# Patient Record
Sex: Female | Born: 1965 | Race: White | Hispanic: Yes | Marital: Married | Smoking: Never smoker
Health system: Southern US, Community
[De-identification: ages and names within clinical notes are randomized; demographics above are authoritative.]

## PROBLEM LIST (undated history)

## (undated) DIAGNOSIS — I10 Essential (primary) hypertension: Secondary | ICD-10-CM

## (undated) DIAGNOSIS — E119 Type 2 diabetes mellitus without complications: Secondary | ICD-10-CM

## (undated) DIAGNOSIS — R7303 Prediabetes: Secondary | ICD-10-CM

## (undated) HISTORY — DX: Prediabetes: R73.03

## (undated) HISTORY — PX: CHOLECYSTECTOMY: SHX55

---

## 2005-07-05 ENCOUNTER — Ambulatory Visit: Payer: Self-pay | Admitting: Family Medicine

## 2005-08-05 ENCOUNTER — Ambulatory Visit: Payer: Self-pay | Admitting: *Deleted

## 2005-08-23 ENCOUNTER — Ambulatory Visit: Payer: Self-pay | Admitting: Family Medicine

## 2005-10-02 ENCOUNTER — Ambulatory Visit: Payer: Self-pay | Admitting: Family Medicine

## 2005-11-30 ENCOUNTER — Ambulatory Visit: Payer: Self-pay | Admitting: Family Medicine

## 2006-01-02 ENCOUNTER — Encounter (INDEPENDENT_AMBULATORY_CARE_PROVIDER_SITE_OTHER): Payer: Self-pay | Admitting: Specialist

## 2006-01-02 ENCOUNTER — Ambulatory Visit: Payer: Self-pay | Admitting: Family Medicine

## 2006-01-03 ENCOUNTER — Ambulatory Visit (HOSPITAL_COMMUNITY): Admission: RE | Admit: 2006-01-03 | Discharge: 2006-01-03 | Payer: Self-pay | Admitting: Internal Medicine

## 2006-05-12 ENCOUNTER — Emergency Department (HOSPITAL_COMMUNITY): Admission: EM | Admit: 2006-05-12 | Discharge: 2006-05-13 | Payer: Self-pay | Admitting: Emergency Medicine

## 2006-05-15 ENCOUNTER — Ambulatory Visit: Payer: Self-pay | Admitting: Family Medicine

## 2006-08-05 ENCOUNTER — Ambulatory Visit: Payer: Self-pay | Admitting: Family Medicine

## 2006-12-03 ENCOUNTER — Ambulatory Visit: Payer: Self-pay | Admitting: Family Medicine

## 2007-06-11 ENCOUNTER — Encounter (INDEPENDENT_AMBULATORY_CARE_PROVIDER_SITE_OTHER): Payer: Self-pay | Admitting: *Deleted

## 2009-03-23 ENCOUNTER — Encounter: Payer: Self-pay | Admitting: Physician Assistant

## 2009-03-23 LAB — CONVERTED CEMR LAB

## 2009-04-11 ENCOUNTER — Ambulatory Visit: Payer: Self-pay | Admitting: Physician Assistant

## 2009-04-11 DIAGNOSIS — M546 Pain in thoracic spine: Secondary | ICD-10-CM | POA: Insufficient documentation

## 2009-04-11 DIAGNOSIS — R030 Elevated blood-pressure reading, without diagnosis of hypertension: Secondary | ICD-10-CM

## 2009-04-11 DIAGNOSIS — G43909 Migraine, unspecified, not intractable, without status migrainosus: Secondary | ICD-10-CM | POA: Insufficient documentation

## 2009-04-11 DIAGNOSIS — R5383 Other fatigue: Secondary | ICD-10-CM

## 2009-04-11 DIAGNOSIS — R5381 Other malaise: Secondary | ICD-10-CM

## 2009-04-11 DIAGNOSIS — R928 Other abnormal and inconclusive findings on diagnostic imaging of breast: Secondary | ICD-10-CM | POA: Insufficient documentation

## 2009-04-13 ENCOUNTER — Encounter: Payer: Self-pay | Admitting: Physician Assistant

## 2009-04-19 ENCOUNTER — Ambulatory Visit (HOSPITAL_BASED_OUTPATIENT_CLINIC_OR_DEPARTMENT_OTHER): Admission: RE | Admit: 2009-04-19 | Discharge: 2009-04-19 | Payer: Self-pay | Admitting: Physician Assistant

## 2009-04-19 ENCOUNTER — Ambulatory Visit: Payer: Self-pay | Admitting: Physician Assistant

## 2009-04-19 ENCOUNTER — Telehealth: Payer: Self-pay | Admitting: Physician Assistant

## 2009-04-21 ENCOUNTER — Ambulatory Visit (HOSPITAL_COMMUNITY): Admission: RE | Admit: 2009-04-21 | Discharge: 2009-04-21 | Payer: Self-pay | Admitting: Internal Medicine

## 2009-04-21 ENCOUNTER — Encounter: Payer: Self-pay | Admitting: Physician Assistant

## 2009-04-23 ENCOUNTER — Ambulatory Visit: Payer: Self-pay | Admitting: Internal Medicine

## 2009-04-23 ENCOUNTER — Encounter: Payer: Self-pay | Admitting: Physician Assistant

## 2009-04-25 ENCOUNTER — Ambulatory Visit: Payer: Self-pay | Admitting: Physician Assistant

## 2009-05-03 ENCOUNTER — Ambulatory Visit: Payer: Self-pay | Admitting: Physician Assistant

## 2009-05-06 ENCOUNTER — Encounter: Payer: Self-pay | Admitting: Physician Assistant

## 2009-05-13 ENCOUNTER — Ambulatory Visit: Payer: Self-pay | Admitting: Physician Assistant

## 2009-05-13 DIAGNOSIS — F329 Major depressive disorder, single episode, unspecified: Secondary | ICD-10-CM

## 2009-06-03 ENCOUNTER — Telehealth: Payer: Self-pay | Admitting: Physician Assistant

## 2009-06-10 ENCOUNTER — Ambulatory Visit: Payer: Self-pay | Admitting: Physician Assistant

## 2009-06-24 ENCOUNTER — Telehealth: Payer: Self-pay | Admitting: Physician Assistant

## 2009-06-24 ENCOUNTER — Ambulatory Visit: Payer: Self-pay | Admitting: Physician Assistant

## 2009-06-27 ENCOUNTER — Encounter: Payer: Self-pay | Admitting: Physician Assistant

## 2009-07-04 ENCOUNTER — Ambulatory Visit: Payer: Self-pay | Admitting: Physician Assistant

## 2009-07-05 ENCOUNTER — Encounter: Payer: Self-pay | Admitting: Physician Assistant

## 2009-07-05 LAB — CONVERTED CEMR LAB
ALT: 18 U/L
AST: 16 U/L
Albumin: 4.7 g/dL
Alkaline Phosphatase: 64 U/L
BUN: 12 mg/dL
Basophils Absolute: 0 K/uL
Basophils Relative: 0 %
CO2: 20 meq/L
Calcium: 9.1 mg/dL
Chloride: 104 meq/L
Cholesterol: 145 mg/dL
Creatinine, Ser: 0.69 mg/dL
Eosinophils Absolute: 0.1 K/uL
Eosinophils Relative: 2 %
Glucose, Bld: 83 mg/dL
HCT: 40.6 %
HDL: 32 mg/dL — ABNORMAL LOW
Hemoglobin: 13.3 g/dL
LDL Cholesterol: 86 mg/dL
Lymphocytes Relative: 31 %
Lymphs Abs: 2.1 K/uL
MCHC: 32.8 g/dL
MCV: 94.4 fL
Monocytes Absolute: 0.4 K/uL
Monocytes Relative: 6 %
Neutro Abs: 4.2 K/uL
Neutrophils Relative %: 61 %
Platelets: 273 K/uL
Potassium: 4.3 meq/L
RBC: 4.3 M/uL
RDW: 14.6 %
Sodium: 139 meq/L
TSH: 1.158 u[IU]/mL
Total Bilirubin: 0.8 mg/dL
Total CHOL/HDL Ratio: 4.5
Total Protein: 7.4 g/dL
Triglycerides: 137 mg/dL
VLDL: 27 mg/dL
WBC: 6.9 10*3/microliter

## 2009-08-05 ENCOUNTER — Ambulatory Visit: Payer: Self-pay | Admitting: Physician Assistant

## 2009-08-25 ENCOUNTER — Ambulatory Visit: Payer: Self-pay | Admitting: Physician Assistant

## 2009-08-25 DIAGNOSIS — B351 Tinea unguium: Secondary | ICD-10-CM

## 2009-08-25 DIAGNOSIS — S139XXA Sprain of joints and ligaments of unspecified parts of neck, initial encounter: Secondary | ICD-10-CM

## 2009-09-22 ENCOUNTER — Ambulatory Visit: Payer: Self-pay | Admitting: Physician Assistant

## 2009-09-22 LAB — CONVERTED CEMR LAB
AST: 12 units/L (ref 0–37)
Albumin: 4.5 g/dL (ref 3.5–5.2)
Alkaline Phosphatase: 73 units/L (ref 39–117)
Indirect Bilirubin: 0.5 mg/dL (ref 0.0–0.9)
Total Bilirubin: 0.6 mg/dL (ref 0.3–1.2)
Total Protein: 7.4 g/dL (ref 6.0–8.3)

## 2009-09-27 ENCOUNTER — Encounter: Payer: Self-pay | Admitting: Physician Assistant

## 2009-10-24 ENCOUNTER — Encounter: Payer: Self-pay | Admitting: Physician Assistant

## 2009-12-26 ENCOUNTER — Ambulatory Visit: Payer: Self-pay | Admitting: Physician Assistant

## 2009-12-26 DIAGNOSIS — M545 Low back pain: Secondary | ICD-10-CM

## 2010-04-01 ENCOUNTER — Encounter (INDEPENDENT_AMBULATORY_CARE_PROVIDER_SITE_OTHER): Payer: Self-pay | Admitting: Internal Medicine

## 2010-10-24 NOTE — Miscellaneous (Signed)
Summary: documentation Hx of LEEP Procedure.   Clinical Lists Changes  Observations: Added new observation of PAST SURG HX: 1.  Reported LEEP procedure following abnormal in [place] some years ago--from [facility]D records (04/01/2010 12:06)        Past History:  Past Surgical History: 1.  Reported LEEP procedure following abnormal in [place] some years ago--from [facility]D records

## 2010-10-24 NOTE — Letter (Signed)
Summary: SOCIAL WORK//AMANDA//APPT DATEE & TIME  SOCIAL WORK//AMANDA//APPT DATEE & TIME   Imported By: Arta Bruce 12/15/2009 10:09:46  _____________________________________________________________________  External Attachment:    Type:   Image     Comment:   External Document

## 2010-10-24 NOTE — Progress Notes (Signed)
Summary: Office Visit/DEPRESSION SCREENING  Office Visit/DEPRESSION SCREENING   Imported By: Arta Bruce 10/17/2009 08:21:06  _____________________________________________________________________  External Attachment:    Type:   Image     Comment:   External Document

## 2010-10-24 NOTE — Letter (Signed)
Summary: RECORDS RECEIVED FROM [facility] RECEIVED FROM [facility]   Imported By: Arta Bruce 05/15/2010 11:33:08  _____________________________________________________________________  External Attachment:    Type:   Image     Comment:   External Document

## 2010-10-24 NOTE — Assessment & Plan Note (Signed)
Summary: follow up app in 4 months with Lorin Picket for depesion//gk   Vital Signs:  Patient profile:   45 year old female Weight:      161.25 pounds Temp:     98.1 degrees F Pulse rate:   76 / minute Pulse rhythm:   regular Resp:     20 per minute BP sitting:   143 / 87  (left arm) Cuff size:   regular  Vitals Entered By: Chauncy Passy, SMA  Serial Vital Signs/Assessments:  Time      Position  BP       Pulse  Resp  Temp     By 9:38 AM             128/88                         Tereso Newcomer PA-C  CC: Pt. is here for a 4 month f/u on depression. Pt. states she is feeling better and has not been taking her meds. for about 1 month. Pt. is still complaining of back pain which usually happens after laying down for a long period. , Hypertension Management Is Patient Diabetic? No Pain Assessment Patient in pain? no       Does patient need assistance? Functional Status Self care Ambulation Normal   Primary Care Provider:  Tereso Newcomer PA-C  CC:  Pt. is here for a 4 month f/u on depression. Pt. states she is feeling better and has not been taking her meds. for about 1 month. Pt. is still complaining of back pain which usually happens after laying down for a long period.  and Hypertension Management.  History of Present Illness: Here for f/u.  Depression:  No longer taking zoloft.  Stopped about 1 month ago.  She does not feel like she needs it anymore.  Feels like her mood is ok.  No suicidal thoughts.  PHQ9=6 today.  Onychomycosis:  She stopped taking lamisil due to nausea.  She got a cream OTC and the nail cleared.  Back pain:  Lumbar area.  Pain occurs with lying down.  Starts around 4 am.  No activity makes worse.  Pain sometimes continues throughout the day.  Moreso at night.  Husband thinks the mattress may cause some of her problems.  She did sleep on floor a few times and this helped.  No radicular symptoms.  No loss of b/b fxn.    Hypertension History:      Negative major  cardiovascular risk factors include female age less than 49 years old and non-tobacco-user status.     Current Medications (verified): 1)  Methocarbamol 500 Mg Tabs (Methocarbamol) .... Take 1-2  Tabs By Mouth At Bedtime As Needed Back Pain 2)  Naproxen 500 Mg Tabs (Naproxen) .... Take 1 Tablet By Mouth Two Times A Day As Needed 3)  Zoloft 50 Mg Tabs (Sertraline Hcl) .... Take 1/2 Tablet Once Daily 4)  Lamisil 250 Mg Tabs (Terbinafine Hcl) .... Take 1 Tablet By Mouth Once A Day For Nail Fungus (Please Write in Spanish)  Allergies (verified): No Known Drug Allergies  Physical Exam  General:  alert, well-developed, and well-nourished.   Head:  normocephalic and atraumatic.   Neck:  supple.   Lungs:  normal breath sounds.   Heart:  normal rate and regular rhythm.   Msk:  neg SLR bilat no spinal tend to palp  Neurologic:  patellar and achilles DTRs 2+ bilat BLE strength  5+ and equal bilat alert & oriented X3 and cranial nerves II-XII intact.   Psych:  normally interactive and good eye contact.     Impression & Recommendations:  Problem # 1:  ELEVATED BLOOD PRESSURE WITHOUT DIAGNOSIS OF HYPERTENSION (ICD-796.2) repeat bp ok cont with TLC  Problem # 2:  DEPRESSION (ICD-311) pt now off meds and feels ok PHQ9=6 today no SI  The following medications were removed from the medication list:    Zoloft 50 Mg Tabs (Sertraline hcl) .Marland Kitchen... Take 1/2 tablet once daily  Problem # 3:  CERVICAL STRAIN (ICD-847.0)  Her updated medication list for this problem includes:    Methocarbamol 500 Mg Tabs (Methocarbamol) .Marland Kitchen... Take 1-2  tabs by mouth at bedtime as needed back pain    Naproxen 500 Mg Tabs (Naproxen) .Marland Kitchen... Take 1 tablet by mouth two times a day as needed  Problem # 4:  BACK PAIN, LUMBAR (ICD-724.2) strain related to mattress suggest she change mattress and take naproxen as needed  Her updated medication list for this problem includes:    Methocarbamol 500 Mg Tabs (Methocarbamol)  .Marland Kitchen... Take 1-2  tabs by mouth at bedtime as needed back pain    Naproxen 500 Mg Tabs (Naproxen) .Marland Kitchen... Take 1 tablet by mouth two times a day as needed  Problem # 5:  Preventive Health Care (ICD-V70.0) due for CPE in 03/2010  Complete Medication List: 1)  Methocarbamol 500 Mg Tabs (Methocarbamol) .... Take 1-2  tabs by mouth at bedtime as needed back pain 2)  Naproxen 500 Mg Tabs (Naproxen) .... Take 1 tablet by mouth two times a day as needed  Hypertension Assessment/Plan:      The patient's hypertensive risk group is category A: No risk factors and no target organ damage.  Her calculated 10 year risk of coronary heart disease is 5 %.  Today's blood pressure is 143/87.    Patient Instructions: 1)  Follow up in July with Roselee Tayloe for CPP. 2)  Take naproxen as needed for pain.  Take with food. 3)  I suggest you look for a new mattress.

## 2010-10-24 NOTE — Progress Notes (Signed)
Summary: Office Visit//DEPRESSION SCREENING  Office Visit//DEPRESSION SCREENING   Imported By: Arta Bruce 02/28/2010 10:56:43  _____________________________________________________________________  External Attachment:    Type:   Image     Comment:   External Document

## 2010-10-24 NOTE — Letter (Signed)
Summary: *HSN Results Follow up  [facility]  [address] [place], [place] [address]   Phone: [phone]  Fax: [phone]      09/27/2009   [facility] [address] [place], [place]  [address]   Dear  Ms. Sara Mora,                            ____S.Drinkard,FNP   ____D. Gore,FNP       ____B. McPherson,MD   ____V. Rankins,MD    ____E. Mulberry,MD    ____N. Daphine Deutscher, FNP  ____D. Reche Dixon, MD    ____K. Philipp Deputy, MD    _x___S. Alben Spittle, PA-C     This letter is to inform you that your recent test(s):  _______Pap Smear    _______Lab Test     _______X-ray    ___x____ is within acceptable limits  _______ requires a medication change  _______ requires a follow-up lab visit  _______ requires a follow-up visit with your provider   Comments:       _________________________________________________________ If you have any questions, please contact our office                     Sincerely,  Tereso Newcomer PA-C [facility]

## 2010-10-24 NOTE — Miscellaneous (Signed)
Summary: Patient completed counseling with LCSW 08/2009   Clinical Lists Changes  Observations: Added new observation of PAST MED HX: Migraine headaches h/o abnormal pap in past ? h/o "colitis" Depression   a.  Pt. completed counseling with LCSW in 08/2009 (10/24/2009 16:07)       Past History:  Past Medical History: Migraine headaches h/o abnormal pap in past ? h/o "colitis" Depression   a.  Pt. completed counseling with LCSW in 08/2009

## 2011-02-06 NOTE — Procedures (Signed)
NAME:  JULITZA, RICKLES          ACCOUNT NO.:  [id number]   MEDICAL RECORD NO.:  [id number]          PATIENT TYPE:  OUT   LOCATION:  SLEEP CENTER                 FACILITY[facility]  [facility]   PHYSICIAN:  Clinton D. Maple Hudson, MD, FCCP, FACPDATE OF BIRTH:  1966-09-02   DATE OF STUDY:  04/23/2009                            NOCTURNAL POLYSOMNOGRAM   REFERRING PHYSICIAN:   REFERRING PHYSICIAN:  Tereso Newcomer, PA-C   INDICATION FOR STUDY:  Hypersomnia with sleep apnea.   EPWORTH SLEEPINESS SCORE:  19/24.  BMI 29.1.  Weight 164 pounds, height  63 inches.  Neck 14 inches.   MEDICATIONS:  Home medications charted and reviewed.   SLEEP ARCHITECTURE:  Total sleep time 316 minutes with sleep efficiency  78.9%.  Stage I 5.5%, stage II 78.5%, stage III absent.  REM 16% of  total sleep time.  Sleep latency 6.5 minutes.  REM latency 153 minutes.  Awake after sleep onset 77.5 minutes.  Arousal index 16.1.  No bedtime  medication taken.  She had interval of sustained wakefulness between  1:30 and 3:00 a.m.   RESPIRATORY DATA:  Apnea/hypopnea index (AHI) 0.4 per hour.  A total of  2 events was scored including one obstructive apnea and one central  apnea.  These events were associated with non supine sleep.  REM AHI 1.2  per hour.  An additional 22 marginal events with respiratory effort  related to arousal were recognized, not meeting duration and intensity  criteria to be considered apneas or hypopneas, but contributing to an  overall respiratory disturbance index (RDI) of 4.5 per hour.  There were  insufficient events to permit CPAP titration by split protocol on the  study night.   OXYGEN DATA:  Mild snoring with oxygen desaturation to a nadir of 92% on  room air.  Mean oxygen saturation through the study was 96.2% on room  air through the study.   CARDIAC DATA:  Normal sinus rhythm.   MOVEMENT-PARASOMNIA:  No significant movement disturbance.  Bathroom x1.   IMPRESSIONS-RECOMMENDATIONS:  Occasional  respiratory events with sleep  disturbance, within normal limits, apnea-hypopnea index 0.4 per hour  (normal range 0 to 5 per hour), respiratory disturbance index 4.5 per  hour.  Mild snoring with oxygen desaturation to a nadir of 92% on room  air.      Clinton D. Maple Hudson, MD, [facility], FACP  Diplomate, Biomedical engineer of Sleep Medicine  Electronically Signed     CDY/MEDQ  D:  04/23/2009 11:23:36  T:  04/23/2009 23:35:38  Job:  [id number]

## 2014-06-28 ENCOUNTER — Ambulatory Visit: Payer: Self-pay

## 2014-07-02 ENCOUNTER — Encounter: Payer: Self-pay | Admitting: Internal Medicine

## 2014-07-02 ENCOUNTER — Ambulatory Visit: Payer: Self-pay | Attending: Internal Medicine | Admitting: Internal Medicine

## 2014-07-02 VITALS — BP 144/84 | HR 65 | Temp 98.3°F | Resp 16 | Wt 158.2 lb

## 2014-07-02 DIAGNOSIS — Z139 Encounter for screening, unspecified: Secondary | ICD-10-CM

## 2014-07-02 DIAGNOSIS — Z23 Encounter for immunization: Secondary | ICD-10-CM

## 2014-07-02 DIAGNOSIS — G8929 Other chronic pain: Secondary | ICD-10-CM

## 2014-07-02 DIAGNOSIS — M545 Low back pain: Secondary | ICD-10-CM | POA: Insufficient documentation

## 2014-07-02 DIAGNOSIS — M62830 Muscle spasm of back: Secondary | ICD-10-CM

## 2014-07-02 DIAGNOSIS — Z8489 Family history of other specified conditions: Secondary | ICD-10-CM | POA: Insufficient documentation

## 2014-07-02 DIAGNOSIS — I10 Essential (primary) hypertension: Secondary | ICD-10-CM

## 2014-07-02 LAB — CBC WITH DIFFERENTIAL/PLATELET
BASOS ABS: 0 10*3/uL (ref 0.0–0.1)
BASOS PCT: 0 % (ref 0–1)
EOS PCT: 2 % (ref 0–5)
Eosinophils Absolute: 0.1 10*3/uL (ref 0.0–0.7)
HEMATOCRIT: 41.7 % (ref 36.0–46.0)
Hemoglobin: 14.1 g/dL (ref 12.0–15.0)
LYMPHS ABS: 2.4 10*3/uL (ref 0.7–4.0)
Lymphocytes Relative: 38 % (ref 12–46)
MCH: 30.7 pg (ref 26.0–34.0)
MCHC: 33.8 g/dL (ref 30.0–36.0)
MCV: 90.8 fL (ref 78.0–100.0)
MONO ABS: 0.4 10*3/uL (ref 0.1–1.0)
MONOS PCT: 6 % (ref 3–12)
NEUTROS ABS: 3.4 10*3/uL (ref 1.7–7.7)
NEUTROS PCT: 54 % (ref 43–77)
PLATELETS: 272 10*3/uL (ref 150–400)
RBC: 4.59 MIL/uL (ref 3.87–5.11)
RDW: 13.9 % (ref 11.5–15.5)
WBC: 6.3 10*3/uL (ref 4.0–10.5)

## 2014-07-02 LAB — COMPLETE METABOLIC PANEL WITH GFR
ALK PHOS: 87 U/L (ref 39–117)
ALT: 33 U/L (ref 0–35)
AST: 23 U/L (ref 0–37)
Albumin: 4.7 g/dL (ref 3.5–5.2)
BUN: 9 mg/dL (ref 6–23)
CALCIUM: 10 mg/dL (ref 8.4–10.5)
CO2: 25 mEq/L (ref 19–32)
Chloride: 103 mEq/L (ref 96–112)
Creat: 0.64 mg/dL (ref 0.50–1.10)
GFR, Est Non African American: >89 mL/min
Glucose, Bld: 94 mg/dL (ref 70–99)
POTASSIUM: 4.2 meq/L (ref 3.5–5.3)
SODIUM: 141 meq/L (ref 135–145)
Total Bilirubin: 1.1 mg/dL (ref 0.2–1.2)
Total Protein: 7.6 g/dL (ref 6.0–8.3)

## 2014-07-02 LAB — LIPID PANEL
Cholesterol: 170 mg/dL (ref 0–200)
HDL: 39 mg/dL — ABNORMAL LOW (ref >39)
LDL Cholesterol: 85 mg/dL (ref 0–99)
TRIGLYCERIDES: 232 mg/dL — AB (ref <150)
Total CHOL/HDL Ratio: 4.4 Ratio
VLDL: 46 mg/dL — ABNORMAL HIGH (ref 0–40)

## 2014-07-02 LAB — TSH: TSH: 0.975 u[IU]/mL (ref 0.350–4.500)

## 2014-07-02 MED ORDER — TRAMADOL HCL 50 MG PO TABS: 50.0000 mg | ORAL_TABLET | Freq: Three times a day (TID) | ORAL | Status: DC | PRN

## 2014-07-02 MED ORDER — BACLOFEN 10 MG PO TABS: 10.0000 mg | ORAL_TABLET | Freq: Every evening | ORAL | Status: DC | PRN

## 2014-07-02 NOTE — Progress Notes (Signed)
Patient here to establish care Complains of back pain and spasms Denies any injury

## 2014-07-02 NOTE — Patient Instructions (Signed)
Plan de alimentacin DASH (DASH Eating Plan) DASH es la sigla en ingls de "Enfoques Alimentarios para Detener la Hipertensin". El plan de alimentacin DASH ha demostrado bajar la presin arterial elevada (hipertensin). Los beneficios adicionales para la salud pueden incluir la disminucin del riesgo de diabetes mellitus tipo2, enfermedades cardacas e ictus. Este plan tambin puede ayudar a adelgazar. QU DEBO SABER ACERCA DEL PLAN DE ALIMENTACIN DASH? Para el plan de alimentacin DASH, seguir las siguientes pautas generales:  Elija los alimentos con un valor porcentual diario de sodio de menos del 5% (segn figura en la etiqueta del alimento).  Use hierbas o aderezos sin sal, en lugar de sal de mesa o sal marina.  Consulte al mdico o farmacutico antes de usar sustitutos de la sal.  Coma productos con bajo contenido de sodio, cuya etiqueta suele decir "bajo contenido de sodio" o "sin agregado de sal".  Coma alimentos frescos.  Coma ms verduras, frutas y productos lcteos con bajo contenido de grasas.  Elija los cereales integrales. Busque la palabra "integral" en el primer lugar de la lista de ingredientes.  Elija el pescado y el pollo o el pavo sin piel ms a menudo que las carnes rojas. Limite el consumo de pescado, carne de ave y carne a 6onzas (170g) por da.  Limite el consumo de dulces, postres, azcares y bebidas azucaradas.  Elija las grasas saludables para el corazn.  Limite el consumo de queso a 1onza (28g) por da.  Consuma ms comida casera y menos de restaurante, de buf y comida rpida.  Limite el consumo de alimentos fritos.  Cocine los alimentos utilizando mtodos que no sean la fritura.  Limite las verduras enlatadas. Si las consume, enjuguelas bien para disminuir el sodio.  Cuando coma en un restaurante, pida que preparen su comida con menos sal o, en lo posible, sin nada de sal. QU ALIMENTOS PUEDO COMER? Pida ayuda a un nutricionista para  conocer las necesidades calricas individuales. Cereales Pan de salvado o integral. Arroz integral. Pastas de salvado o integrales. Quinua, trigo burgol y cereales integrales. Cereales con bajo contenido de sodio. Tortillas de harina de maz o de salvado. Pan de maz integral. Galletas saladas integrales. Galletas con bajo contenido de sodio. Vegetales Verduras frescas o congeladas (crudas, al vapor, asadas o grilladas). Jugos de tomate y verduras con contenido bajo o reducido de sodio. Pasta y salsa de tomate con contenido bajo o reducido de sodio. Verduras enlatadas con bajo contenido de sodio o reducido de sodio.  Frutas Frutas frescas, en conserva (en su jugo natural) o frutas congeladas. Carnes y otros productos con protenas Carne de res molida (al 85% o ms magra), carne de res de animales alimentados con pastos o carne de res sin la grasa. Pollo o pavo sin piel. Carne de pollo o de pavo molida. Cerdo sin la grasa. Todos los pescados y frutos de mar. Huevos. Porotos, guisantes o lentejas secos. Frutos secos y semillas sin sal. Frijoles enlatados sin sal. Lcteos Productos lcteos con bajo contenido de grasas, como leche descremada o al 1%, quesos reducidos en grasas o al 2%, ricota con bajo contenido de grasas o queso cottage, o yogur natural con bajo contenido de grasas. Quesos con contenido bajo o reducido de sodio. Grasas y aceites Margarinas en barra que no contengan grasas trans. Mayonesa y alios para ensaladas livianos o reducidos en grasas (reducidos en sodio). [place]ac[place]. Aceites de crtamo, oliva o canola. Mantequilla natural de man o almendra. Otros Palomitas de maz y pretzels sin sal.   Los artculos mencionados arriba pueden no ser una lista completa de las bebidas o los alimentos recomendados. Comunquese con el nutricionista para conocer ms opciones. QU ALIMENTOS NO SE RECOMIENDAN? Cereales Pan blanco. Pastas blancas. Arroz blanco. Pan de maz refinado. Bagels y  croissants. Galletas saladas que contengan grasas trans. Vegetales Vegetales con crema o fritos. Verduras en salsa de queso. Verduras enlatadas comunes. Pasta y salsa de tomate en lata comunes. Jugos comunes de tomate y de verduras. Frutas Frutas secas. Fruta enlatada en almbar liviano o espeso. Jugo de frutas. Carnes y otros productos con protenas Cortes de carne con grasa. Costillas, alas de pollo, tocineta, salchicha, mortadela, salame, chinchulines, tocino, perros calientes, salchichas alemanas y embutidos envasados. Frutos secos y semillas con sal. Frijoles con sal en lata. Lcteos Leche entera o al 2%, crema, mezcla de leche y crema, y queso crema. Yogur entero o endulzado. Quesos o queso azul con alto contenido de grasas. Cremas no lcteas y coberturas batidas. Quesos procesados, quesos para untar o cuajadas. Condimentos Sal de cebolla y ajo, sal condimentada, sal de mesa y sal marina. Salsas en lata y envasadas. Salsa Worcestershire. Salsa trtara. Salsa barbacoa. Salsa teriyaki. Salsa de soja, incluso la que tiene contenido reducido de sodio. Salsa de carne. Salsa de pescado. Salsa de ostras. Salsa rosada. Rbano picante. Ketchup y mostaza. Saborizantes y tiernizantes para carne. Caldo en cubitos. Salsa picante. Salsa tabasco. Adobos. Aderezos para tacos. Salsas. Grasas y aceites Mantequilla, margarina en barra, manteca de cerdo, grasa, mantequilla clarificada y grasa de tocino. Aceites de coco, de palmiste o de palma. Aderezos comunes para ensalada. Otros Pickles y aceitunas. Palomitas de maz y pretzels con sal. Los artculos mencionados arriba pueden no ser una lista completa de las bebidas y los alimentos que se deben evitar. Comunquese con el nutricionista para obtener ms informacin. DNDE PUEDO ENCONTRAR MS INFORMACIN? Instituto Nacional del Corazn, del Pulmn y de la Sangre (National Heart, Lung, and Blood Institute):  www.nhlbi.nih.gov/health/health-topics/topics/dash/ Document Released: 08/30/2011 Document Revised: 01/25/2014 ExitCare Patient Information 2015 [employer]. This information is not intended to replace advice given to you by your health care provider. Make sure you discuss any questions you have with your health care provider.   

## 2014-07-02 NOTE — Progress Notes (Signed)
Patient Demographics  Sara Mora, is a 48 y.o. female  ZOX:[id number]  [id number]  DOB - 02/22/1966  CC:  Chief Complaint  Patient presents with  . Establish Care    back pain'       HPI: Sara Mora is a 48 y.o. female here today to establish medical care. She has history of hypertension and is taking Ziac  as per patient she has not taken the blood pressure medication today, blood pressure is borderline  Elevated, denies any headache dizziness chest and shortness of breath, she reported to have chronic lower back pain denies any fall or trauma, as per patient she was prescribed Flexeril which does not help her with the symptoms.  Patient has No headache, No chest pain, No abdominal pain - No Nausea, No new weakness tingling or numbness, No Cough - SOB.  Allergies  Allergen Reactions  . Naproxen     Intolerant, gastritis    History reviewed. No pertinent past medical history. No current outpatient prescriptions on file prior to visit.   No current facility-administered medications on file prior to visit.   Family History  Problem Relation Age of Onset  . Hypertension Mother    History   Social History  . Marital Status: Married    Spouse Name: N/A    Number of Children: N/A  . Years of Education: N/A   Occupational History  . Not on file.   Social History Main Topics  . Smoking status: Never Smoker   . Smokeless tobacco: Not on file  . Alcohol Use: No  . Drug Use: Not on file  . Sexual Activity: Not on file   Other Topics Concern  . Not on file   Social History Narrative  . No narrative on file    Review of Systems: Constitutional: Negative for fever, chills, diaphoresis, activity change, appetite change and fatigue. HENT: Negative for ear pain, nosebleeds, congestion, facial swelling, rhinorrhea, neck pain, neck stiffness and ear discharge.  Eyes: Negative for pain, discharge, redness, itching and visual  disturbance. Respiratory: Negative for cough, choking, chest tightness, shortness of breath, wheezing and stridor.  Cardiovascular: Negative for chest pain, palpitations and leg swelling. Gastrointestinal: Negative for abdominal distention. Genitourinary: Negative for dysuria, urgency, frequency, hematuria, flank pain, decreased urine volume, difficulty urinating and dyspareunia.  Musculoskeletal: Negative for back pain, joint swelling, arthralgia and gait problem. Neurological: Negative for dizziness, tremors, seizures, syncope, facial asymmetry, speech difficulty, weakness, light-headedness, numbness and headaches.  Hematological: Negative for adenopathy. Does not bruise/bleed easily. Psychiatric/Behavioral: Negative for hallucinations, behavioral problems, confusion, dysphoric mood, decreased concentration and agitation.    Objective:   Filed Vitals:   07/02/14 0909  BP: 144/84  Pulse: 65  Temp: 98.3 F (36.8 C)  Resp: 16    Physical Exam: Constitutional: Patient appears well-developed and well-nourished. No distress. HENT: Normocephalic, atraumatic, External right and left ear normal. Oropharynx is clear and moist.  Eyes: Conjunctivae and EOM are normal. PERRLA, no scleral icterus. Neck: Normal ROM. Neck supple. No JVD. No tracheal deviation. No thyromegaly. CVS: RRR, S1/S2 +, no murmurs, no gallops, no carotid bruit.  Pulmonary: Effort and breath sounds normal, no stridor, rhonchi, wheezes, rales.  Abdominal: Soft. BS +, no distension, tenderness, rebound or guarding.  Musculoskeletal: Normal range of motion. No edema and no tenderness.  Neuro: Alert. Normal reflexes, muscle tone coordination. No cranial nerve deficit. Skin: Skin is warm and dry. No rash noted. Not diaphoretic. No erythema. No pallor. Psychiatric: Normal mood  and affect. Behavior, judgment, thought content normal.  Lab Results  Component Value Date   WBC 6.9 07/04/2009   HGB 13.3 07/04/2009   HCT 40.6  07/04/2009   MCV 94.4 07/04/2009   PLT 273 07/04/2009   Lab Results  Component Value Date   CREATININE 0.69 07/04/2009   BUN 12 07/04/2009   NA 139 07/04/2009   K 4.3 07/04/2009   CL 104 07/04/2009   CO2 20 07/04/2009    No results found for this basename: HGBA1C   Lipid Panel     Component Value Date/Time   CHOL 145 07/04/2009 2145   TRIG 137 07/04/2009 2145   HDL 32* 07/04/2009 2145   CHOLHDL 4.5 Ratio 07/04/2009 2145   VLDL 27 07/04/2009 2145   LDLCALC 86 07/04/2009 2145       Assessment and plan:   1. Essential hypertension Advise patient for DASH diet continue with current medication. Will check blood chemistry.   2. Encounter for immunization Flu shot given today.   3. Back muscle spasm Apply heating pad , prescribed baclofen use each bedtime - baclofen (LIORESAL) 10 MG tablet; Take 1 tablet (10 mg total) by mouth at bedtime as needed for muscle spasms.  Dispense: 30 each; Refill: 2  4. Screening Ordered baseline blood work. - CBC with Differential - COMPLETE METABOLIC PANEL WITH GFR - TSH - Lipid panel - Vit D  25 hydroxy (rtn osteoporosis monitoring) - Hemoglobin A1c   5. Chronic lower back pain  - DG Lumbar Spine Complete; Future - traMADol (ULTRAM) 50 MG tablet; Take 1 tablet (50 mg total) by mouth every 8 (eight) hours as needed for moderate pain.  Dispense: 30 tablet; Refill: 0        Health Maintenance  -Mammogram/Pap Smear: patient [place] this [place]   -Vaccinations:  Flu shot given today   Return in about 3 months (around 10/02/2014) for hypertension.   Doris CheadleADVANI, Massimo Hartland, MD

## 2014-07-03 LAB — HEMOGLOBIN A1C
HEMOGLOBIN A1C: 6 % — AB (ref <5.7)
MEAN PLASMA GLUCOSE: 126 mg/dL — AB (ref <117)

## 2014-07-03 LAB — VITAMIN D 25 HYDROXY (VIT D DEFICIENCY, FRACTURES): VIT D 25 HYDROXY: 37 ng/mL (ref 30–89)

## 2014-07-12 ENCOUNTER — Other Ambulatory Visit: Payer: Self-pay | Admitting: Emergency Medicine

## 2014-07-12 ENCOUNTER — Telehealth: Payer: Self-pay | Admitting: *Deleted

## 2014-07-12 MED ORDER — ACETAMINOPHEN-CODEINE #3 300-30 MG PO TABS: 1.0000 | ORAL_TABLET | ORAL | Status: DC | PRN

## 2014-07-12 NOTE — Telephone Encounter (Signed)
Patient can be given Tylenol No. 3 to take 1 by mouth every 8 hourly when necessary for pain, also advise patient to do an x-ray of back which was already ordered

## 2014-07-12 NOTE — Telephone Encounter (Signed)
Message copied by Dyann KiefGIRALDEZ, Juel Ripley M on Mon Jul 12, 2014 12:22 PM ------      Message from: Doris CheadleADVANI, DEEPAK      Created: Mon Jul 12, 2014 11:15 AM       Blood work reviewed noticed impaired fasting glucose and elevated triglycerides, call and advise patient for low carbohydrate and low-fat diet.       ------

## 2014-07-12 NOTE — Telephone Encounter (Signed)
Pt aware of medicine changes, stated will do Xray tomorrow

## 2014-07-12 NOTE — Telephone Encounter (Signed)
Pt aware of lab results,  Stated Pain medicine, Tramadol, not helping,  Feeling anxiety and HA.

## 2014-07-26 ENCOUNTER — Telehealth: Payer: Self-pay | Admitting: *Deleted

## 2014-07-26 ENCOUNTER — Ambulatory Visit
Admission: RE | Admit: 2014-07-26 | Discharge: 2014-07-26 | Disposition: A | Discharge status: Home / Self Care | Payer: No Typology Code available for payment source | Source: Ambulatory Visit | Attending: Internal Medicine | Admitting: Internal Medicine

## 2014-07-26 DIAGNOSIS — M545 Low back pain, unspecified: Secondary | ICD-10-CM

## 2014-07-26 DIAGNOSIS — G8929 Other chronic pain: Secondary | ICD-10-CM

## 2014-07-26 NOTE — Telephone Encounter (Signed)
Pt aware of Xray results 

## 2014-07-26 NOTE — Telephone Encounter (Signed)
-----   Message from Doris Cheadleeepak Advani, MD sent at 07/26/2014 12:44 PM EST ----- Call and let the patient know that her x-ray was negative and  reported  IMPRESSION: There is no acute bony abnormality nor significant chronic degenerative change of the lumbar spine.

## 2014-07-26 NOTE — Telephone Encounter (Signed)
Left voice message with female to return call 

## 2014-08-18 ENCOUNTER — Ambulatory Visit
Admission: RE | Admit: 2014-08-18 | Discharge: 2014-08-18 | Disposition: A | Discharge status: Home / Self Care | Payer: No Typology Code available for payment source | Source: Ambulatory Visit | Attending: Infectious Disease | Admitting: Infectious Disease

## 2014-08-18 ENCOUNTER — Other Ambulatory Visit: Payer: Self-pay | Admitting: Infectious Disease

## 2014-08-18 DIAGNOSIS — Z111 Encounter for screening for respiratory tuberculosis: Secondary | ICD-10-CM

## 2014-08-24 ENCOUNTER — Ambulatory Visit
Admission: RE | Admit: 2014-08-24 | Discharge: 2014-08-24 | Disposition: A | Discharge status: Home / Self Care | Payer: No Typology Code available for payment source | Source: Ambulatory Visit | Attending: Infectious Disease | Admitting: Infectious Disease

## 2014-08-24 ENCOUNTER — Other Ambulatory Visit: Payer: Self-pay | Admitting: Infectious Disease

## 2014-08-24 DIAGNOSIS — R509 Fever, unspecified: Secondary | ICD-10-CM

## 2014-08-24 DIAGNOSIS — M546 Pain in thoracic spine: Secondary | ICD-10-CM

## 2014-08-24 DIAGNOSIS — R7611 Nonspecific reaction to tuberculin skin test without active tuberculosis: Secondary | ICD-10-CM

## 2014-09-13 ENCOUNTER — Ambulatory Visit: Payer: Self-pay | Admitting: Internal Medicine

## 2015-01-05 ENCOUNTER — Ambulatory Visit: Payer: Self-pay | Admitting: Family Medicine

## 2015-01-14 ENCOUNTER — Ambulatory Visit: Payer: Self-pay | Attending: Internal Medicine | Admitting: Internal Medicine

## 2015-01-14 ENCOUNTER — Encounter: Payer: Self-pay | Admitting: Internal Medicine

## 2015-01-14 VITALS — BP 135/80 | HR 66 | Temp 98.9°F | Resp 18 | Ht 63.0 in | Wt 162.8 lb

## 2015-01-14 DIAGNOSIS — M4696 Unspecified inflammatory spondylopathy, lumbar region: Secondary | ICD-10-CM | POA: Insufficient documentation

## 2015-01-14 DIAGNOSIS — G8929 Other chronic pain: Secondary | ICD-10-CM

## 2015-01-14 DIAGNOSIS — M25511 Pain in right shoulder: Secondary | ICD-10-CM | POA: Insufficient documentation

## 2015-01-14 DIAGNOSIS — E781 Pure hyperglyceridemia: Secondary | ICD-10-CM | POA: Insufficient documentation

## 2015-01-14 DIAGNOSIS — Z9049 Acquired absence of other specified parts of digestive tract: Secondary | ICD-10-CM | POA: Insufficient documentation

## 2015-01-14 DIAGNOSIS — M545 Low back pain: Secondary | ICD-10-CM | POA: Insufficient documentation

## 2015-01-14 DIAGNOSIS — R7309 Other abnormal glucose: Secondary | ICD-10-CM | POA: Insufficient documentation

## 2015-01-14 DIAGNOSIS — R7611 Nonspecific reaction to tuberculin skin test without active tuberculosis: Secondary | ICD-10-CM | POA: Insufficient documentation

## 2015-01-14 DIAGNOSIS — M62830 Muscle spasm of back: Secondary | ICD-10-CM | POA: Insufficient documentation

## 2015-01-14 DIAGNOSIS — I10 Essential (primary) hypertension: Secondary | ICD-10-CM | POA: Insufficient documentation

## 2015-01-14 DIAGNOSIS — M1990 Unspecified osteoarthritis, unspecified site: Secondary | ICD-10-CM

## 2015-01-14 DIAGNOSIS — R739 Hyperglycemia, unspecified: Secondary | ICD-10-CM

## 2015-01-14 DIAGNOSIS — M25512 Pain in left shoulder: Secondary | ICD-10-CM | POA: Insufficient documentation

## 2015-01-14 DIAGNOSIS — R002 Palpitations: Secondary | ICD-10-CM | POA: Insufficient documentation

## 2015-01-14 LAB — COMPLETE METABOLIC PANEL WITH GFR
ALBUMIN: 4.6 g/dL (ref 3.5–5.2)
ALT: 25 U/L (ref 0–35)
AST: 21 U/L (ref 0–37)
Alkaline Phosphatase: 84 U/L (ref 39–117)
BILIRUBIN TOTAL: 0.7 mg/dL (ref 0.2–1.2)
BUN: 12 mg/dL (ref 6–23)
CALCIUM: 9.9 mg/dL (ref 8.4–10.5)
CHLORIDE: 103 meq/L (ref 96–112)
CO2: 27 meq/L (ref 19–32)
Creat: 0.62 mg/dL (ref 0.50–1.10)
Glucose, Bld: 82 mg/dL (ref 70–99)
POTASSIUM: 4.3 meq/L (ref 3.5–5.3)
SODIUM: 140 meq/L (ref 135–145)
TOTAL PROTEIN: 7.6 g/dL (ref 6.0–8.3)

## 2015-01-14 LAB — CBC WITH DIFFERENTIAL/PLATELET
BASOS ABS: 0.1 10*3/uL (ref 0.0–0.1)
BASOS PCT: 1 % (ref 0–1)
EOS PCT: 2 % (ref 0–5)
Eosinophils Absolute: 0.1 10*3/uL (ref 0.0–0.7)
HEMATOCRIT: 41.1 % (ref 36.0–46.0)
HEMOGLOBIN: 14.5 g/dL (ref 12.0–15.0)
Lymphocytes Relative: 35 % (ref 12–46)
Lymphs Abs: 2.2 10*3/uL (ref 0.7–4.0)
MCH: 32.2 pg (ref 26.0–34.0)
MCHC: 35.3 g/dL (ref 30.0–36.0)
MCV: 91.3 fL (ref 78.0–100.0)
MONO ABS: 0.4 10*3/uL (ref 0.1–1.0)
MPV: 11.1 fL (ref 8.6–12.4)
Monocytes Relative: 7 % (ref 3–12)
NEUTROS ABS: 3.5 10*3/uL (ref 1.7–7.7)
NEUTROS PCT: 55 % (ref 43–77)
Platelets: 264 10*3/uL (ref 150–400)
RBC: 4.5 MIL/uL (ref 3.87–5.11)
RDW: 13.6 % (ref 11.5–15.5)
WBC: 6.3 10*3/uL (ref 4.0–10.5)

## 2015-01-14 LAB — GLUCOSE, POCT (MANUAL RESULT ENTRY): POC GLUCOSE: 103 mg/dL — AB (ref 70–99)

## 2015-01-14 LAB — POCT GLYCOSYLATED HEMOGLOBIN (HGB A1C): HEMOGLOBIN A1C: 5.8

## 2015-01-14 MED ORDER — BACLOFEN 10 MG PO TABS: 10.0000 mg | ORAL_TABLET | Freq: Every evening | ORAL | Status: DC | PRN

## 2015-01-14 NOTE — Patient Instructions (Addendum)
La diabetes mellitus y los alimentos (Diabetes Mellitus and Food) Es importante que controle su nivel de azcar en la sangre (glucosa). El nivel de glucosa en sangre depende en gran medida de lo que usted come. Comer alimentos saludables en las cantidades [place] a lo largo del Training and development officer, aproximadamente a la misma hora [employer], lo ayudar a Chief Technology Officer su nivel de Multimedia programmer. Tambin puede ayudarlo a retrasar o Patent attorney de la diabetes mellitus. Comer de Affiliated Computer Services saludable incluso puede ayudarlo a Chartered loss adjuster de presin arterial y a Science writer o Theatre manager un peso saludable.  CMO PUEDEN AFECTARME LOS ALIMENTOS? Carbohidratos Los carbohidratos afectan el nivel de glucosa en sangre ms que cualquier otro tipo de alimento. El nutricionista lo ayudar a Teacher, adult education cuntos carbohidratos puede consumir en cada comida y ensearle a contarlos. El recuento de carbohidratos es importante para mantener la glucosa en sangre en un nivel saludable, en especial si utiliza insulina o toma determinados medicamentos para la diabetes mellitus. Alcohol El alcohol puede provocar disminuciones sbitas de la glucosa en sangre (hipoglucemia), en especial si utiliza insulina o toma determinados medicamentos para la diabetes mellitus. La hipoglucemia es una afeccin que puede poner en peligro la vida. Los sntomas de la hipoglucemia (somnolencia, mareos y Data processing manager) son similares a los sntomas de haber consumido mucho alcohol.  Si el mdico lo autoriza a beber alcohol, hgalo con moderacin y siga estas pautas:  Las mujeres no deben beber ms de un trago por da, y los hombres no deben beber ms de dos tragos por Training and development officer. Un trago es igual a:  12 onzas (355 ml) de cerveza  5 onzas de vino (150 ml) de vino  1,5onzas (35ml) de bebidas espirituosas  No beba con el estmago vaco.  Mantngase hidratado. Beba agua, gaseosas dietticas o t helado sin azcar.  Las gaseosas comunes, los jugos y  otros refrescos podran contener muchos carbohidratos y se Civil Service fast streamer. QU ALIMENTOS NO SE RECOMIENDAN? Cuando haga las elecciones de alimentos, es importante que recuerde que todos los alimentos son distintos. Algunos tienen menos nutrientes que otros por porcin, aunque podran tener la misma cantidad de caloras o carbohidratos. Es difcil darle al cuerpo lo que necesita cuando consume alimentos con menos nutrientes. Estos son algunos ejemplos de alimentos que debera evitar ya que contienen muchas caloras y carbohidratos, pero pocos nutrientes:  Physicist, medical trans (la mayora de los alimentos procesados incluyen grasas trans en la etiqueta de Informacin nutricional).  Gaseosas comunes.  Jugos.  Caramelos.  Dulces, como tortas, pasteles, rosquillas y [place].  Comidas fritas. QU ALIMENTOS PUEDO COMER? Consuma alimentos ricos en nutrientes, que nutrirn el cuerpo y lo mantendrn saludable. Los alimentos que debe comer tambin dependern de varios factores, como:  Las caloras que necesita.  Los medicamentos que toma.  Su peso.  El nivel de glucosa en [place].  El [place] de presin arterial.  El nivel de colesterol. Tambin debe consumir una variedad de [place], como:  Protenas, como carne, aves, pescado, tofu, frutos secos y semillas (las protenas de [place] magros son mejores).  Lambert Mody.  Verduras.  Productos lcteos, como [place], queso y yogur (descremados son mejores).  Panes, granos, pastas, cereales, arroz y frijoles.  Grasas, como aceite de [place], [place] sin grasas trans, aceite de canola, aguacate y [place]. TODOS LOS QUE PADECEN DIABETES MELLITUS TIENEN EL [place] PLAN DE [place]? Dado que todas las personas que padecen diabetes mellitus son distintas, no hay un solo plan de comidas que funcione para todos. Es muy  importante que se rena con un nutricionista que lo ayudar a crear un plan de comidas adecuado para usted. Document Released: 12/18/2007  Document Revised: 09/15/2013 [facility] Patient Information 2015 [place]. This information is not intended to replace advice given to you by your health care provider. Make sure you discuss any questions you have with your health care provider. Plan de alimentacin DASH (DASH Eating Plan) DASH es la sigla en ingls de "Enfoques Alimentarios para Detener la Hipertensin". El plan de alimentacin DASH ha demostrado bajar la presin arterial elevada (hipertensin). Los beneficios adicionales para la salud pueden incluir la disminucin del riesgo de diabetes mellitus tipo2, enfermedades cardacas e ictus. Este plan tambin puede ayudar a Horticulturist, commercial. QU DEBO SABER ACERCA DEL PLAN DE ALIMENTACIN DASH? Para el plan de alimentacin DASH, seguir las siguientes pautas generales:  Elija los alimentos con un valor porcentual diario de sodio de menos del 5% (segn figura en la etiqueta del alimento).  Use hierbas o aderezos sin sal, en lugar de sal de mesa o sal marina.  Consulte al mdico o farmacutico antes de usar sustitutos de la sal.  Coma productos con bajo contenido de sodio, cuya etiqueta suele decir "bajo contenido de sodio" o "sin agregado de sal".  Coma alimentos frescos.  Coma ms verduras, frutas y productos lcteos con bajo contenido de [place].  Elija los cereales integrales. Busque la palabra "integral" en Equities trader de la lista de ingredientes.  Elija el pescado y el pollo o el pavo sin piel ms a menudo que las carnes rojas. Limite el consumo de pescado, carne de ave y carne a 6onzas (170g) por Training and development officer.  Limite el consumo de dulces, postres, azcares y bebidas azucaradas.  Elija las grasas saludables para el corazn.  Limite el consumo de queso a 1onza (28g) por Training and development officer.  Consuma ms comida casera y menos de restaurante, de buf y comida rpida.  Limite el consumo de alimentos fritos.  Cocine los alimentos utilizando mtodos que no sean la fritura.  Limite las  verduras enlatadas. Si las consume, enjuguelas bien para disminuir el sodio.  Cuando coma en un restaurante, pida que preparen su comida con menos sal o, en lo posible, sin nada de sal. QU ALIMENTOS PUEDO COMER? Pida ayuda a un nutricionista para conocer las necesidades calricas individuales. Cereales Pan de salvado o integral. Arroz integral. Pastas de salvado o integrales. Quinua, trigo burgol y cereales integrales. Cereales con bajo contenido de sodio. Tortillas de harina de maz o de salvado. Pan de maz integral. Galletas saladas integrales. Galletas con bajo contenido de [place]. Vegetales Verduras frescas o congeladas (crudas, al vapor, asadas o grilladas). Jugos de tomate y verduras con contenido bajo o reducido de sodio. Pasta y salsa de tomate con contenido bajo o [place]. Verduras enlatadas con bajo contenido de sodio o reducido de sodio.  Lambert Mody Lambert Mody frescas, en conserva (en su jugo natural) o frutas congeladas. Carnes y otros productos con protenas Carne de res molida (al 85% o ms [place]), carne de res de animales alimentados con pastos o carne de res sin la grasa. Pollo o pavo sin piel. Carne de pollo o de [place]. Cerdo sin la grasa. Todos los pescados y frutos de mar. Huevos. Porotos, guisantes o lentejas secos. Frutos secos y semillas sin sal. Frijoles enlatados sin sal. Lcteos Productos lcteos con bajo contenido de grasas, como [place] o al 1%, quesos reducidos en grasas o al 2%, ricota con bajo contenido de grasas o [employer],  o yogur natural con bajo contenido de Bayardgrasas. Quesos con contenido bajo o reducido de sodio. Grasas y Writeraceites Margarinas en barra que no contengan grasas trans. Mayonesa y alios para ensaladas livianos o reducidos en grasas (reducidos en sodio). [place]. Aceites de crtamo, oliva o canola. Mantequilla natural de man o almendra. Otros Palomitas de maz y pretzels sin sal. Los artculos mencionados arriba pueden no ser  Raytheonuna lista completa de las bebidas o los alimentos recomendados. Comunquese con el nutricionista para conocer ms opciones. QU ALIMENTOS NO SE RECOMIENDAN? Cereales Pan blanco. Pastas blancas. Arroz blanco. Pan de maz refinado. Bagels y croissants. Galletas saladas que contengan grasas trans. Vegetales Vegetales con crema o fritos. Verduras en salsa de [place]. Verduras enlatadas comunes. Pasta y salsa de tomate en lata comunes. Jugos comunes de tomate y de verduras. Nils PyleFrutas Frutas secas. Fruta enlatada en almbar liviano o espeso. Jugo de frutas. Carnes y otros productos con protenas [place] de carne con Holiday representativegrasa. Costillas, alas de pollo, tocineta, salchicha, mortadela, salame, chinchulines, tocino, perros calientes, salchichas alemanas y embutidos envasados. Frutos secos y semillas con sal. Frijoles con sal en lata. Lcteos Leche entera o al 2%, crema, mezcla de [place]etteleche y crema, y queso crema. Yogur entero o endulzado. Quesos o queso azul con alto contenido de Neurosurgeongrasas. Cremas no lcteas y coberturas batidas. Quesos procesados, quesos para untar o cuajadas. Condimentos Sal de cebolla y ajo, sal condimentada, sal de mesa y sal marina. Salsas en lata y envasadas. Salsa [place]. Salsa trtara. Salsa barbacoa. Salsa teriyaki. Salsa de soja, incluso la que tiene contenido reducido de Eielson AFBsodio. Salsa de carne. Salsa de pescado. Salsa de Fremontostras. Salsa rosada. Rbano picante. Ketchup y mostaza. Saborizantes y tiernizantes para carne. Caldo en cubitos. Salsa picante. Salsa tabasco. Adobos. Aderezos para tacos. Salsas. Grasas y [address] Mainaceites Mantequilla, Indiamargarina en barra, [place] de Gladbrookcerdo, Dublingrasa, Singaporemantequilla clarificada y Steffanie Rainwatergrasa de tocino. Aceites de coco, de palmiste o de palma. Aderezos comunes para ensalada. Otros Pickles y Chautauquaaceitunas. Palomitas de maz y pretzels con sal. Los artculos mencionados arriba pueden no ser Raytheonuna lista completa de las bebidas y los alimentos que se Theatre stage managerdeben evitar. Comunquese con el  nutricionista para obtener ms informacin. DNDE Raelyn MoraPUEDO ENCONTRAR MS INFORMACIN? Instituto Nacional del Weogufkaorazn, del Pulmn y de la Sangre (National Heart, Lung, and Blood Institute): CablePromo.itwww.nhlbi.nih.gov/health/health-topics/topics/dash/ Document Released: 08/30/2011 Document Revised: 01/25/2014 [facility] Center[facility] Patient Information 2015 [place]ExitCare, MarylandLLC. This information is not intended to replace advice given to you by your health care provider. Make sure you discuss any questions you have with your health care provider. Dieta para el control del colesterol y las grasas  (Fat and Cholesterol Control Diet) Los niveles de grasa y colesterol en la sangre y en los rganos se ven influidos por la dieta. Los American Electric Powerniveles altos de grasa y Oncologistcolesterol pueden conducir a enfermedades del Programmer, multimediacorazn, de los pequeos y los grandes vasos sanguneos, de la vescula biliar, el hgado y el pncreas.  CONTROL DE LA GRASA Y EL COLESTEROL CON LA DIETA  Aunque el ejercicio y el estilo de vida son factores importantes, su dieta es la clave. Esto se debe a que se sabe que ciertos alimentos hacen subir el colesterol y otros lo Mexicobajan. El objetivo debe ser ConAgra Foodsequilibrar los alimentos, de modo que tengan un efecto sobre el colesterol y, an ms importante, Microbiologistreemplazar las grasas saturadas y trans con otros tipos de grasas, como las monoinsaturadas y las poliinsaturadas y cidos grasos omega-3.  En promedio, una persona no debe consumir ms de 15 a  17 g de grasas saturadas por da. Las grasas saturadas y trans se consideran grasas "malas", ya que elevan el colesterol LDL. Las grasas saturadas se encuentran principalmente en productos animales como carne, [place] y crema. Sin embargo, eso no significa que tenga que renunciar a todas sus comidas favoritas. Actualmente, hay buenos sustitutos bajos en colesterol, bajos en grasas para la Harley-Davidson de las cosas que le gusta comer. Elija aquellos alimentos alternativos que sean bajos en grasas o sin grasas.  Elija cortes de peceto o lomo de carne roja. Estos tipos de cortes contienen menos grasa y colesterol. Pollo (sin la piel), pescado, ternera y [place] de [place] molida son excelentes opciones. Eliminar las carnes grasas, como las salchichas y [place]. Los mariscos contienen poca o casi nada de grasas saturadas. Consuma una porcin de 3 oz (85 g) de carne magra, aves o pescado.  Las grasas trans tambin se llaman "aceites parcialmente hidrogenados". Son aceites manipulados cientficamente de [place] que son slidos a Publishing rights manager, tienen una larga vida y Glass blower/designer sabor y la textura de los alimentos a los que se Scientist, clinical (histocompatibility and immunogenetics). Las grasas trans se encuentran en la Aristes, [place], crackers y alimentos horneados.  Al hornear y Water quality scientist, el aceite es un buen sustituto de la [place]. Los aceites monoinsaturados son beneficiosos, sobre todo porque se cree que reducen el colesterol LDL y aumentan el HDL. Los aceites que hay que evitar completamente son los aceites tropicales saturados, como el de coco y palma.  Recuerde consumir una gran cantidad de alimentos de los grupos que estn naturalmente libres de grasas saturadas y grasas trans, e incluya pescado, frutas, verduras, frijoles, granos (cebada, arroz, cuscs, trigo bulgur) y pastas (sin salsas de crema).  IDENTIFICACIN DE LOS ALIMENTOS QUE REDUCEN LAS GRASAS Y ELCOLESTEROL  La fibra soluble puede reducir el colesterol. Este tipo de [place] se Occupational psychologist en las frutas como [place], verduras como el [place], papas y [place], las legumbres Lubrizol Corporation frijoles, guisantes y Therapist, occupational y granos como la cebada. Los alimentos enriquecidos con Public librarian (fitosteroles) tambin pueden reducir Print production planner. Consuma al menos 2 g por da de estos alimentos para un efecto de disminucin del colesterol.  Lea las etiquetas de los paquetes para identificar los alimentos bajos en grasas saturadas, en grasas trans y los bajos en grasas en el supermercado. Seleccione los  Northrop Grumman tienen slo 2 a 3 g de grasa saturada por onza. Utilice margarina saludable para el corazn que sea [place] de grasas trans o aceites parcialmente hidrogenados. Al comprar productos de panadera (galletas, crackers), se deben evitar los aceites parcialmente hidrogenados. Panes y panecillos deben hacerse con cereales integrales (trigo integral o harina de avena integral en lugar de " harina " o " harina enriquecida ") Compre sopas en lata que no sean cremosas, con bajo contenido de sal y sin grasas adicionadas.  TCNICAS DE PREPARACIN DE LOS ALIMENTOS  Nunca prepare los alimentos fritos. Si usted debe frer, [employer] en muy poca grasa o use un aerosol de cocina anti adherente. Siempre que sea posible, debe hervir, hornear o asar las carnes y preparar las verduras al vapor. En lugar de agregar mantequilla o margarina a las verduras, use limn y hierbas, pur de [place] y canela (para la calabaza y la batata). Utilice yogur natural sin grasa, salsas y aderezos bajos en grasa para ensaladas.  BAJO EN GRASAS SATURADAS / SUSTITUTOS BAJOS EN GRASA  Carnes / grasas saturadas (g)  Evite: Bife, veteado (3 oz/85 g) / 11 g  Elija: Bife, magro(3 oz/85 g) / 4 g  Evite: Hamburguesa (3 oz/85 g) / 7 g  Elija: Hamburguesa, magra (3 oz/85 g) / 5 g  Evite: Jamn (3 oz/85 g) / 6 g  Elija: Jamn, corte magro (3 oz/85 g) / 2,4 g  Evite: Pollo con piel, carne oscura (3 oz/85 g) / 4 g  Elija: Pollo, sin piel, carne oscura (3 oz/85 g) / 2 g  Evite: Pollo con piel, carne blanca (3 oz/85 g) / 2,5 g  Elija: Pollo, sin piel, carne blanca (3 oz/85 g) / 1 g Lcteos / Grasa saturada (g)   Evite: Leche entera (1 taza) / 5 g  Elija: Leche descremada, 2% (1 taza) / 3 g  Elija: Leche descremada, 1% (1 taza) / 1,5 g  Elija: Leche descremada, 1 taza (0,3 g).  Evite: Queso duro (1 oz/28 g) / 6 g  Elija: Queso de [employer]PG [employer] (1 oz/28 g) / 2 a 3 g  Evite: Queso cottage, 4% de grasa (1 taza) /  6,5 g  Elija: Queso cottage bajo en grasa, 1% de grasa (1 taza) / 1,5 g  Evite: [place]ado (1 taza) / 9 g  Elija: Sorbete (1 taza) / 2,5 g  Elija: Yogur congelado descremado (1 taza) / 0,3 g  Elija: Barra de frutas congeladas / trace  Evite: Crema batida (1 cucharada) / 3,5 g  Elija: Crema batida no lctea (1 cucharada) / 1 g Condimentos / Grasas Saturadas (g)   Evite: Mayonesa (1 cucharada) / 2 g  Elija: Mayonesa baja en grasa (1 cucharada) / 1 g  Evite: Mantequilla (1 cucharada) / 7 g  Elija: Margarina light extra (1 cucharada) / 1 g  Evite: Aceite de coco (1 cucharada) / 11,8 g  Elija: Aceite de oliva (1 cucharada) / 1,8 g  Elija: Aceite de maz (1 cucharada) / 1,7 g  Elija: Aceite de crtamo (1 cucharada) / 1,2 g  Elija: Aceite de girasol (1 cucharada) / 1,4 g  Elija: Aceite de soja (1 cucharada) / 0 mg / 2,4 g  Elija: Aceite de canola (1 cucharada) / 0 mg / 1 g Document Released: 09/10/2005 Document Revised: 01/05/2013 ExitCare Patient Information 2015 [place], [place]. This information is not intended to replace advice given to you by your health care provider. Make sure you discuss any questions you have with your health care provider.

## 2015-01-14 NOTE — Progress Notes (Signed)
Patient presents for evaluation of lower back and bilateral shoulder pain. Pain in Left >Right. Pain been ongoing for about 3 years. Pain increasing-pain currently 5/10. [place].  Using "light therapy" per patient for pain. Patient would like to see a specialist.

## 2015-01-14 NOTE — Progress Notes (Signed)
MRN: [id number] Name: Sara Mora  Sex: female Age: 49 y.o. DOB: 1966/03/15  Allergies: Naproxen  Chief Complaint  Patient presents with  . Back Pain  . Shoulder Pain     HPI: Patient is 49 y.o. 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 She is requesting refill on her muscle relaxant.  History reviewed. No pertinent past medical history.  Past Surgical History  Procedure Laterality Date  . Cholecystectomy        Medication List       This list is accurate as of: 01/14/15 10:18 AM.  Always use your most recent med list.               acetaminophen-codeine 300-30 MG per tablet  Commonly known as:  TYLENOL #3  Take 1 tablet by mouth every 4 (four) hours as needed for moderate pain.     baclofen 10 MG tablet  Commonly known as:  LIORESAL  Take 1 tablet (10 mg total) by mouth at bedtime as needed for muscle spasms.     bisoprolol-hydrochlorothiazide 5-6.25 MG per tablet  Commonly known as:  ZIAC  Take 1 tablet by mouth daily.     traMADol 50 MG tablet  Commonly known as:  ULTRAM  Take 1 tablet (50 mg total) by mouth every 8 (eight) hours as needed for moderate pain.     vitamin B-6 25 MG tablet  Commonly known as:  pyridOXINE  Take 25 mg by mouth daily.        Meds ordered this encounter  Medications  . vitamin B-6 (PYRIDOXINE) 25 MG tablet    Sig: Take 25 mg by mouth daily.  . baclofen (LIORESAL) 10 MG tablet    Sig:  Take 1 tablet (10 mg total) by mouth at bedtime as needed for muscle spasms.    Dispense:  30 each    Refill:  2    Immunization History  Administered Date(s) Administered  . Influenza Whole 08/25/2009  . Influenza,inj,Quad PF,36+ Mos 07/02/2014  . Td 04/11/2009    Family History  Problem Relation Age of Onset  . Hypertension Mother     History  Substance Use Topics  . Smoking status: Never Smoker   . Smokeless tobacco: Not on file  . Alcohol Use: No    Review of Systems   As noted in HPI  Filed Vitals:   01/14/15 0948  BP: 135/80  Pulse:   Temp:   Resp:     Physical Exam  Physical Exam  Constitutional: No distress.  Eyes: EOM are normal. Pupils are equal, round, and reactive to light.  Cardiovascular: Normal rate and regular rhythm.   Pulmonary/Chest: Breath sounds normal. No respiratory distress. She has no wheezes. She has no rales.  Musculoskeletal: She exhibits no edema.    CBC    Component Value Date/Time   WBC 6.3 07/02/2014 0940   RBC 4.59 07/02/2014 0940   HGB 14.1 07/02/2014 0940  HCT 41.7 07/02/2014 0940   PLT 272 07/02/2014 0940   MCV 90.8 07/02/2014 0940   LYMPHSABS 2.4 07/02/2014 0940   MONOABS 0.4 07/02/2014 0940   EOSABS 0.1 07/02/2014 0940   BASOSABS 0.0 07/02/2014 0940    CMP     Component Value Date/Time   NA 141 07/02/2014 0940   K 4.2 07/02/2014 0940   CL 103 07/02/2014 0940   CO2 25 07/02/2014 0940   GLUCOSE 94 07/02/2014 0940   BUN 9 07/02/2014 0940   CREATININE 0.64 07/02/2014 0940   CREATININE 0.69 07/04/2009 2145   CALCIUM 10.0 07/02/2014 0940   PROT 7.6 07/02/2014 0940   ALBUMIN 4.7 07/02/2014 0940   AST 23 07/02/2014 0940   ALT 33 07/02/2014 0940   ALKPHOS 87 07/02/2014 0940   BILITOT 1.1 07/02/2014 0940   GFRNONAA >89 07/02/2014 0940   GFRAA >89 07/02/2014 0940    Lab Results  Component Value Date/Time   CHOL 170 07/02/2014 09:40 AM    Lab Results  Component Value Date/Time   HGBA1C 5.80  01/14/2015 09:34 AM   HGBA1C 6.0* 07/02/2014 09:40 AM    Lab Results  Component Value Date/Time   AST 23 07/02/2014 09:40 AM    Assessment and Plan  HyperglycemiaSuresh prediabetes  - Plan:  Results for orders placed or performed in visit on 01/14/15  HgB A1c  Result Value Ref Range   Hemoglobin A1C 5.80   Glucose (CBG)  Result Value Ref Range   POC Glucose 103.0 (A) 70 - 99 mg/dl   Hemoglobin N5AA1c has trended down from 6.0% to 5.8%, advised patient for low carbohydrate diet.  Essential hypertension - Plan: advised patient for DASH diet continue with current medication COMPLETE METABOLIC PANEL WITH GFR  Hypertriglyceridemia Advised patient for low carbohydrate, we'll check fasting lipid panel on the next visit  Palpitations - Plan: CBC with Differential/Platelet, TSH, COMPLETE METABOLIC PANEL WITH GFR  Arthritis/Chronic lower back pain/Back muscle spasm - Plan: baclofen (LIORESAL) 10 MG tablet   Health Maintenance  -Pap Smear:as per patient she had a Pap smear done -Mammogram:As per patient she is up-to-date with mammogram   No Follow-up on file.   This note has been created with Education officer, environmentalDragon speech recognition software and smart phrase technology. Any transcriptional errors are unintentional.    Doris CheadleADVANI, Asencion Loveday, MD

## 2015-01-15 LAB — TSH: TSH: 0.934 u[IU]/mL (ref 0.350–4.500)

## 2015-01-17 ENCOUNTER — Telehealth: Payer: Self-pay

## 2015-01-17 NOTE — Telephone Encounter (Signed)
In house interpreter used Patient not available Left message on voice mail to return our call 

## 2015-01-17 NOTE — Telephone Encounter (Signed)
-----   Message from Doris Cheadleeepak Advani, MD sent at 01/17/2015  9:23 AM EDT ----- Call and let the Patient know that blood work is normal.

## 2015-01-17 NOTE — Telephone Encounter (Signed)
Patient called is returning nurse's phone call to review results. °

## 2015-02-11 ENCOUNTER — Telehealth: Payer: Self-pay | Admitting: Internal Medicine

## 2015-02-11 NOTE — Telephone Encounter (Signed)
Patient is calling to request blood work results from last visit. Please f/u

## 2016-07-31 DIAGNOSIS — M1990 Unspecified osteoarthritis, unspecified site: Secondary | ICD-10-CM | POA: Insufficient documentation

## 2017-03-30 DIAGNOSIS — E785 Hyperlipidemia, unspecified: Secondary | ICD-10-CM | POA: Insufficient documentation

## 2017-03-30 DIAGNOSIS — R739 Hyperglycemia, unspecified: Secondary | ICD-10-CM | POA: Insufficient documentation

## 2017-03-30 DIAGNOSIS — M8580 Other specified disorders of bone density and structure, unspecified site: Secondary | ICD-10-CM | POA: Insufficient documentation

## 2017-04-24 DIAGNOSIS — R590 Localized enlarged lymph nodes: Secondary | ICD-10-CM | POA: Insufficient documentation

## 2017-04-24 DIAGNOSIS — R519 Headache, unspecified: Secondary | ICD-10-CM | POA: Insufficient documentation

## 2017-04-24 DIAGNOSIS — B001 Herpesviral vesicular dermatitis: Secondary | ICD-10-CM | POA: Insufficient documentation

## 2017-05-06 ENCOUNTER — Encounter (HOSPITAL_COMMUNITY): Payer: Self-pay | Admitting: Nurse Practitioner

## 2017-05-06 ENCOUNTER — Emergency Department (HOSPITAL_COMMUNITY): Payer: Self-pay

## 2017-05-06 ENCOUNTER — Emergency Department (HOSPITAL_COMMUNITY)
Admission: EM | Admit: 2017-05-06 | Discharge: 2017-05-06 | Disposition: A | Discharge status: Home / Self Care | Payer: Self-pay | Attending: Emergency Medicine | Admitting: Emergency Medicine

## 2017-05-06 DIAGNOSIS — R059 Cough, unspecified: Secondary | ICD-10-CM

## 2017-05-06 DIAGNOSIS — M791 Myalgia, unspecified site: Secondary | ICD-10-CM

## 2017-05-06 DIAGNOSIS — I10 Essential (primary) hypertension: Secondary | ICD-10-CM | POA: Insufficient documentation

## 2017-05-06 DIAGNOSIS — R05 Cough: Secondary | ICD-10-CM | POA: Insufficient documentation

## 2017-05-06 DIAGNOSIS — Z79899 Other long term (current) drug therapy: Secondary | ICD-10-CM | POA: Insufficient documentation

## 2017-05-06 LAB — CBC WITH DIFFERENTIAL/PLATELET
BASOS ABS: 0 10*3/uL (ref 0.0–0.1)
BASOS PCT: 1 %
Eosinophils Absolute: 0.3 10*3/uL (ref 0.0–0.7)
Eosinophils Relative: 4 %
HEMATOCRIT: 39.2 % (ref 36.0–46.0)
Hemoglobin: 13.1 g/dL (ref 12.0–15.0)
LYMPHS PCT: 29 %
Lymphs Abs: 1.9 10*3/uL (ref 0.7–4.0)
MCH: 30.8 pg (ref 26.0–34.0)
MCHC: 33.4 g/dL (ref 30.0–36.0)
MCV: 92.2 fL (ref 78.0–100.0)
MONO ABS: 0.3 10*3/uL (ref 0.1–1.0)
Monocytes Relative: 5 %
NEUTROS ABS: 4 10*3/uL (ref 1.7–7.7)
NEUTROS PCT: 61 %
Platelets: 358 10*3/uL (ref 150–400)
RBC: 4.25 MIL/uL (ref 3.87–5.11)
RDW: 13.4 % (ref 11.5–15.5)
WBC: 6.6 10*3/uL (ref 4.0–10.5)

## 2017-05-06 LAB — COMPREHENSIVE METABOLIC PANEL
ALBUMIN: 4 g/dL (ref 3.5–5.0)
ALK PHOS: 86 U/L (ref 38–126)
ALT: 23 U/L (ref 14–54)
ANION GAP: 8 (ref 5–15)
AST: 20 U/L (ref 15–41)
BILIRUBIN TOTAL: 0.9 mg/dL (ref 0.3–1.2)
BUN: 11 mg/dL (ref 6–20)
CALCIUM: 9.4 mg/dL (ref 8.9–10.3)
CO2: 26 mmol/L (ref 22–32)
Chloride: 105 mmol/L (ref 101–111)
Creatinine, Ser: 0.76 mg/dL (ref 0.44–1.00)
GLUCOSE: 91 mg/dL (ref 65–99)
POTASSIUM: 4.2 mmol/L (ref 3.5–5.1)
Sodium: 139 mmol/L (ref 135–145)
TOTAL PROTEIN: 7.9 g/dL (ref 6.5–8.1)

## 2017-05-06 LAB — PREGNANCY, URINE: Preg Test, Ur: NEGATIVE

## 2017-05-06 LAB — I-STAT CG4 LACTIC ACID, ED: Lactic Acid, Venous: 0.99 mmol/L (ref 0.5–1.9)

## 2017-05-06 MED ORDER — IOPAMIDOL (ISOVUE-300) INJECTION 61%
INTRAVENOUS | Status: AC
  Administered 2017-05-06: 75 mL
  Filled 2017-05-06: qty 75

## 2017-05-06 NOTE — ED Notes (Signed)
Patient at CT

## 2017-05-06 NOTE — ED Provider Notes (Signed)
7[facility]   Pending CT scan of chest to ensure active TB. This is negative.   Plan: Pt to be discharged with f/u at the health department -- she needs f/u with to TB test to determine if she needs treatment.   Patient and family updated. Agree with plan.   BP 112/79   Pulse 67   Temp 98.2 F (36.8 C) (Oral)   Resp 16   Ht 5\' 2"  (1.575 m)   Wt 70.3 kg (155 lb)   SpO2 97%   BMI 28.35 kg/m       Renne CriglerGeiple, Sakura Denis, PA-C 05/06/17 1953    Melene PlanFloyd, Dan, DO 05/06/17 2001

## 2017-05-06 NOTE — ED Notes (Signed)
Patient to CT.

## 2017-05-06 NOTE — ED Notes (Signed)
CT made aware of IV placement.

## 2017-05-06 NOTE — ED Notes (Signed)
PT states understanding of care given. PT ambulated from ED to car with a steady gait. 

## 2017-05-06 NOTE — ED Provider Notes (Signed)
[facility]-EMERGENCY DEPT Provider Note   CSN: [id number] Arrival date & time: 05/06/17  1111     History   Chief Complaint Chief Complaint  Patient presents with  . URI    HPI Sara Mora is a 51 y.o. female.  HPI  Patient, with past medical history of hypertension, presents to ED for evaluation of TB exposure. She states that her dry cough, chills, body aches began about 3 weeks ago. She states that her brother-in-law was diagnosed with TB last week. She states that they have visited each other's houses numerous times. She was seen initially by her primary care and was told that she had a common cold. Patient states that she had a similar TB exposure several months ago. She was put on prophylactic antibiotics at that time. She denies any chest pain, hemoptysis, weight loss, history of DVT or PE, history of MI, abdominal pain, nausea, vomiting.  History reviewed. No pertinent past medical history.  Patient Active Problem List   Diagnosis Date Noted  . Essential hypertension 07/02/2014  . Back muscle spasm 07/02/2014  . BACK PAIN, LUMBAR 12/26/2009  . ONYCHOMYCOSIS 08/25/2009  . CERVICAL STRAIN 08/25/2009  . DEPRESSION 05/13/2009  . MIGRAINE HEADACHE 04/11/2009  . BACK PAIN, THORACIC REGION 04/11/2009  . FATIGUE 04/11/2009  . UNSPECIFIED ABNORMAL MAMMOGRAM 04/11/2009  . ELEVATED BLOOD PRESSURE WITHOUT DIAGNOSIS OF HYPERTENSION 04/11/2009    Past Surgical History:  Procedure Laterality Date  . CHOLECYSTECTOMY      OB History    No data available       Home Medications    Prior to Admission medications   Medication Sig Start Date End Date Taking? Authorizing Provider  acetaminophen-codeine (TYLENOL #3) 300-30 MG per tablet Take 1 tablet by mouth every 4 (four) hours as needed for moderate pain. Patient not taking: Reported on 01/14/2015 07/12/14   Doris Cheadle, MD  baclofen (LIORESAL) 10 MG tablet Take 1 tablet (10 mg total) by mouth at bedtime as  needed for muscle spasms. 01/14/15   Doris Cheadle, MD  bisoprolol-hydrochlorothiazide (ZIAC) 5-6.25 MG per tablet Take 1 tablet by mouth daily.    [provider]  traMADol (ULTRAM) 50 MG tablet Take 1 tablet (50 mg total) by mouth every 8 (eight) hours as needed for moderate pain. 07/02/14   Doris Cheadle, MD  vitamin B-6 (PYRIDOXINE) 25 MG tablet Take 25 mg by mouth daily.    [provider]    Family History Family History  Problem Relation Age of Onset  . Hypertension Mother     Social History Social History  Substance Use Topics  . Smoking status: Never Smoker  . Smokeless tobacco: Never Used  . Alcohol use No     Allergies   Naproxen   Review of Systems Review of Systems  Constitutional: Positive for chills and fatigue. Negative for appetite change and fever.  HENT: Negative for ear pain, rhinorrhea, sneezing and sore throat.   Eyes: Negative for photophobia and visual disturbance.  Respiratory: Positive for cough. Negative for chest tightness, shortness of breath and wheezing.   Cardiovascular: Negative for chest pain and palpitations.  Gastrointestinal: Negative for abdominal pain, blood in stool, constipation, diarrhea, nausea and vomiting.  Genitourinary: Negative for dysuria, hematuria and urgency.  Musculoskeletal: Negative for myalgias.  Skin: Negative for rash.  Neurological: Negative for dizziness, weakness and light-headedness.     Physical Exam Updated Vital Signs BP 117/79 (BP Location: Right Arm)   Pulse 61   Temp 98.2 F (36.8  C) (Oral)   Resp 16   Ht 5\' 2"  (1.575 m)   Wt 70.3 kg (155 lb)   SpO2 97%   BMI 28.35 kg/m   Physical Exam  Constitutional: She appears well-developed and well-nourished. No distress.  HENT:  Head: Normocephalic and atraumatic.  Nose: Nose normal.  Eyes: Conjunctivae and EOM are normal. Left eye exhibits no discharge. No scleral icterus.  Neck: Normal range of motion. Neck supple.    Cardiovascular: Normal rate, regular rhythm, normal heart sounds and intact distal pulses.  Exam reveals no gallop and no friction rub.   No murmur heard. Pulmonary/Chest: Effort normal and breath sounds normal. No respiratory distress.  Abdominal: Soft. Bowel sounds are normal. She exhibits no distension. There is no tenderness. There is no guarding.  Musculoskeletal: Normal range of motion. She exhibits no edema.  Neurological: She is alert. She exhibits normal muscle tone. Coordination normal.  Skin: Skin is warm and dry. No rash noted.  Psychiatric: She has a normal mood and affect.  Nursing note and vitals reviewed.    ED Treatments / Results  Labs (all labs ordered are listed, but only abnormal results are displayed) Labs Reviewed  CBC WITH DIFFERENTIAL/PLATELET  COMPREHENSIVE METABOLIC PANEL  QUANTIFERON TB GOLD ASSAY (BLOOD)  I-STAT CG4 LACTIC ACID, ED  I-STAT CG4 LACTIC ACID, ED    EKG  EKG Interpretation None       Radiology Dg Chest 2 View  Result Date: 05/06/2017 CLINICAL DATA:  Cough, fever, chest pain and tuberculosis exposure. EXAM: CHEST  2 VIEW COMPARISON:  08/18/2014 FINDINGS: The heart size and mediastinal contours are within normal limits. Lungs demonstrate low volumes with bibasilar atelectasis. There is no evidence of pulmonary edema, consolidation, pneumothorax, nodule or pleural fluid. The visualized skeletal structures are unremarkable. IMPRESSION: Bibasilar atelectasis.  No active cardiopulmonary disease. Electronically Signed   By: Irish LackGlenn  Yamagata M.D.   On: 05/06/2017 12:39    Procedures Procedures (including critical care time)  Medications Ordered in ED Medications - No data to display   Initial Impression / Assessment and Plan / ED Course  I have reviewed the triage vital signs and the nursing notes.  Pertinent labs & imaging results that were available during my care of the patient were reviewed by me and considered in my medical  decision making (see chart for details).     Patient presents to ED for TB exposure. Reports dry cough, chills, sweats (she is unsure if related to menopause) for the past 3 weeks. Reports exposure to brother in law who was diagnosed with TB one week ago. On physical exam she is nontoxic appears, not in acute distress. Does not appear acutely or chronically ill. She has normal chest expansion. Lungs clear to auscultation bilaterally. CBC unremarkable. Lactate normal. CMP unremarkable. TB Gold tests obtained which will take 4 days for return. Chest x-ray shows no active cardiopulmonary disease.  1500: Spoke to infectious disease who recommended that we send patient back to health department for further management or any prophylactic medication. Also recommended that CT chest could be obtained. Will obtain CT chest. Care signed out to oncoming provider, Rhea BleacherJosh Geiple PA-C with CT pending.  Final Clinical Impressions(s) / ED Diagnoses   Final diagnoses:  None    New Prescriptions New Prescriptions   No medications on file     Dietrich PatesKhatri, Gearold Wainer, PA-C 05/06/17 1544    Alvira MondaySchlossman, Erin, MD 05/08/17 1406

## 2017-05-06 NOTE — ED Notes (Signed)
Pt transported to CT ?

## 2017-05-06 NOTE — ED Triage Notes (Signed)
Pt presents with c/o URI symptoms. Her symptoms began about three weeks ago. She reports intermittent fevers and chills, body aches and a dry cough. She says that she was seen by her doctor and treated for an infection with acyclovir. She is concerned because her brother in law is currently being treated for active TB by the health department. She denies any weight loss, bloody sputum. She sometimes sweats at night but that has been normal for her related to her menopause. She went to the health department today seeking treatment and they sent her to the ED for further evaluation.

## 2017-05-06 NOTE — Discharge Instructions (Signed)
Please read and follow all provided instructions.  Your diagnoses today include:  1. Cough   2. Myalgia     Tests performed today include:  Blood counts and electrolytes  Urine test  Chest x-ray and chest CT - no signs of active tuberculosis  Tuberculosis test - this will need to be followed up by the health department  Vital signs. See below for your results today.   Medications prescribed:   None  Take any prescribed medications only as directed.  Home care instructions:  Follow any educational materials contained in this packet.  Use over-the-counter medications as desired for treatment of your symptoms.  BE VERY CAREFUL not to take multiple medicines containing Tylenol (also called acetaminophen). Doing so can lead to an overdose which can damage your liver and cause liver failure and possibly death.   Follow-up instructions: Please follow-up with your primary care provider at the health department in the next 3 days for further evaluation of your symptoms.   Return instructions:   Please return to the Emergency Department if you experience worsening symptoms.   Return with worsening chest pain, shortness of breath, new symptoms or other concerns.  Please return if you have any other emergent concerns.  Additional Information:  Your vital signs today were: BP 112/79    Pulse 67    Temp 98.2 F (36.8 C) (Oral)    Resp 16    Ht 5\' 2"  (1.575 m)    Wt 70.3 kg (155 lb)    SpO2 97%    BMI 28.35 kg/m  If your blood pressure (BP) was elevated above 135/85 this visit, please have this repeated by your doctor within one month. --------------

## 2017-05-09 LAB — QUANTIFERON IN TUBE
QFT TB AG MINUS NIL VALUE: 0 IU/mL
QUANTIFERON MITOGEN VALUE: 6.22 IU/mL
QUANTIFERON TB AG VALUE: 0.07 IU/mL
QUANTIFERON TB GOLD: NEGATIVE
Quantiferon Nil Value: 0.07 IU/mL

## 2017-05-09 LAB — QUANTIFERON TB GOLD ASSAY (BLOOD)

## 2018-10-17 DIAGNOSIS — E119 Type 2 diabetes mellitus without complications: Secondary | ICD-10-CM | POA: Insufficient documentation

## 2019-07-08 IMAGING — CT CT CHEST W/ CM
2 of 4 series · 15 of 36 positions shown, 18 images · IV contrast (iopamidol)
Comparison: None.

CLINICAL DATA: Persistent cough.  Tuberculosis suspected.

EXAM:
CT CHEST WITH CONTRAST
TECHNIQUE: Multidetector CT imaging of the chest was performed during
intravenous contrast administration.
CONTRAST:  75mL 7RULUN-GXX IOPAMIDOL (7RULUN-GXX) INJECTION 61%

[Series 3: chest wo · axial · 0.74mm/px · z∈[+1108,+1320]mm · 12 of 126 slices shown, 15 images]
[im 10/126  mediastinal]
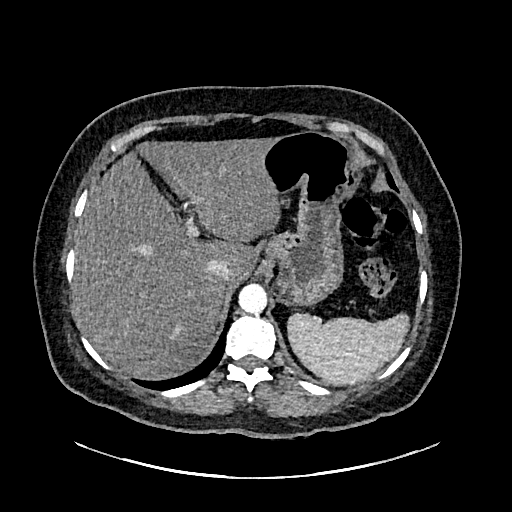
[im 10/126  lung]
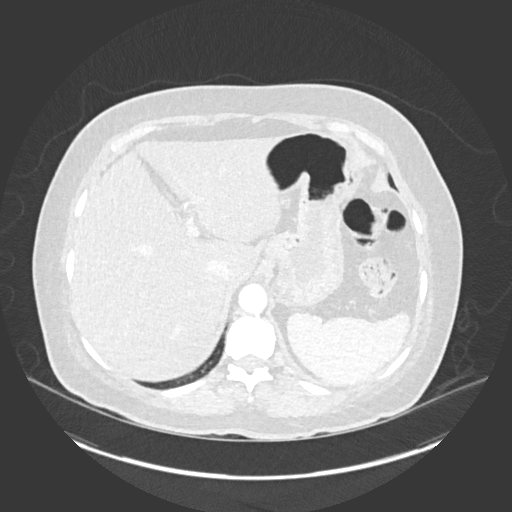
[im 20/126  lung]
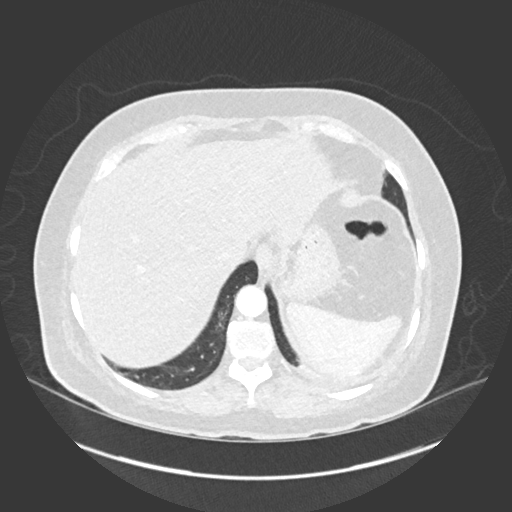
[im 29/126  lung]
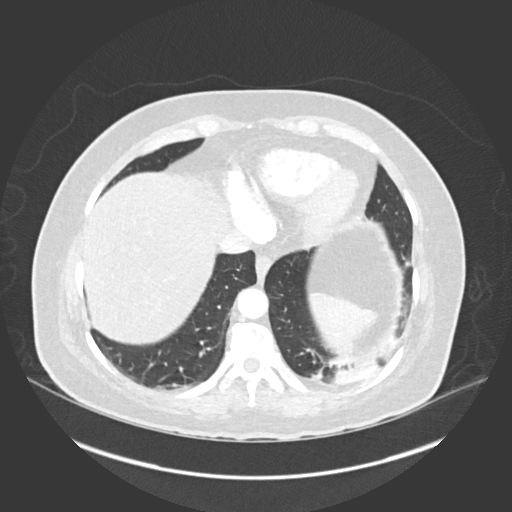
[im 39/126  lung]
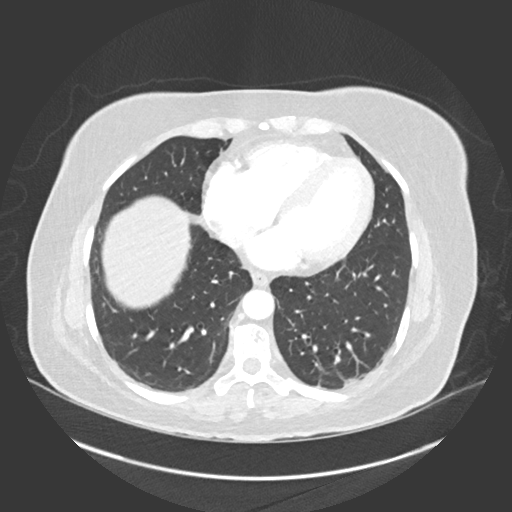
[im 49/126  mediastinal]
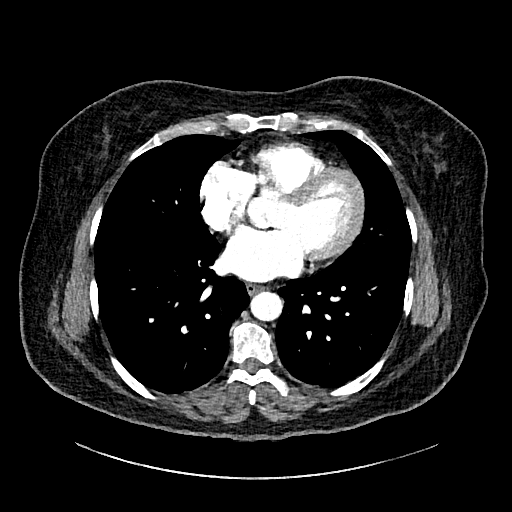
[im 49/126  lung]
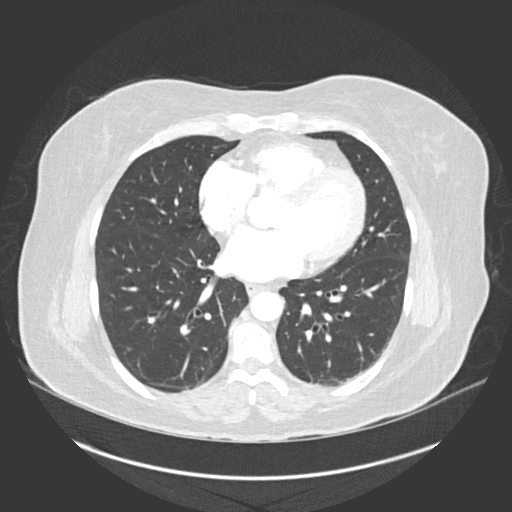
[im 58/126  lung]
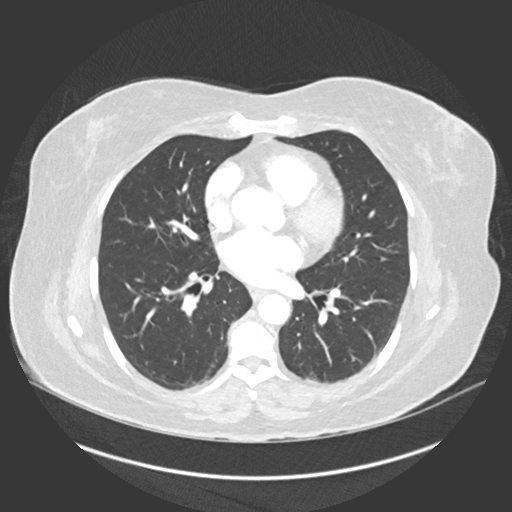
[im 68/126  lung]
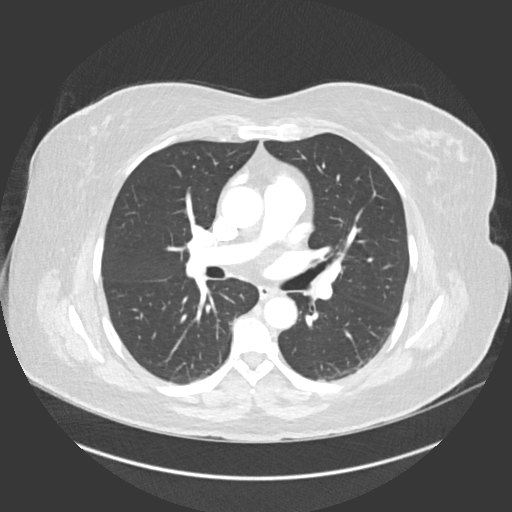
[im 77/126  lung]
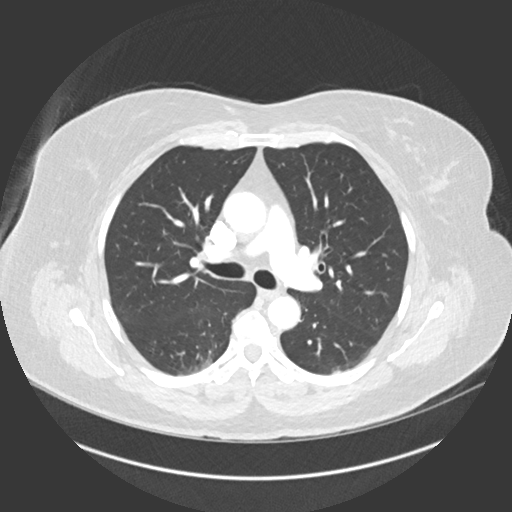
[im 87/126  mediastinal]
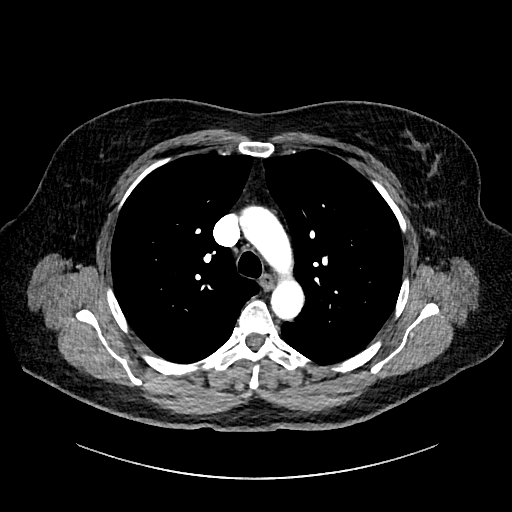
[im 87/126  lung]
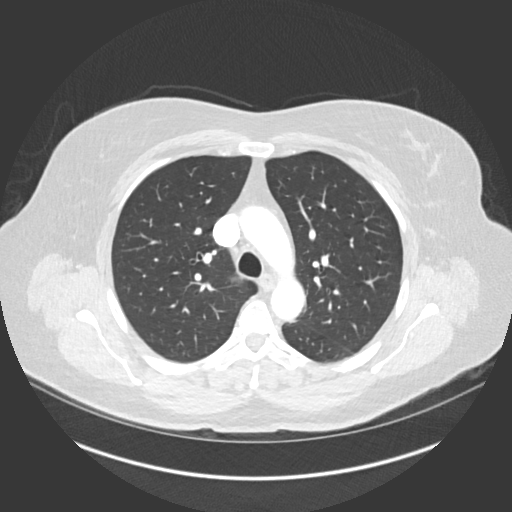
[im 97/126  lung]
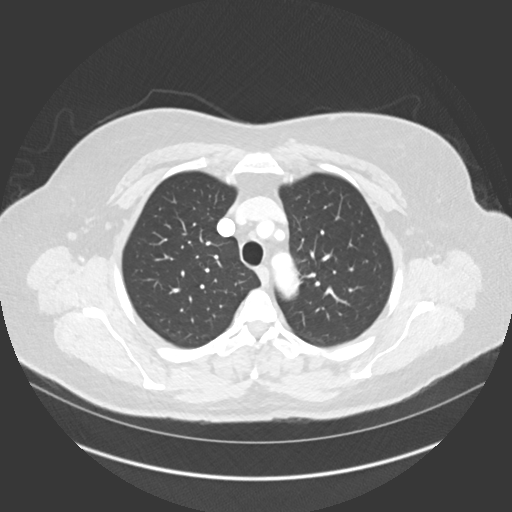
[im 106/126  lung]
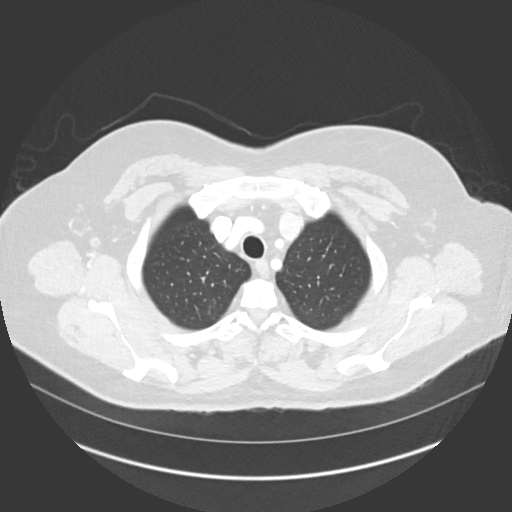
[im 116/126  lung]
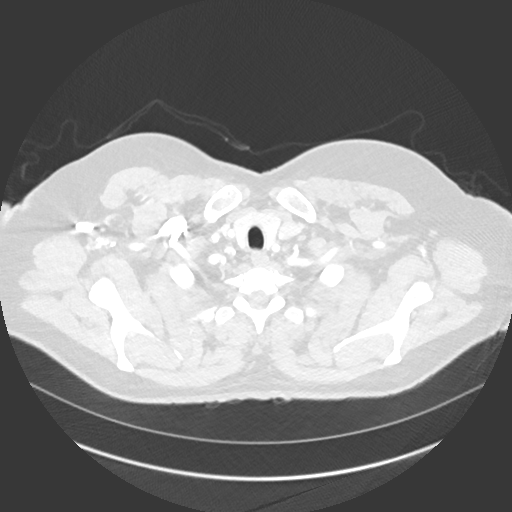

[Series 6: cor · coronal · 0.52mm/px · 3 of 151 slices shown]
[im 31/151  lung]
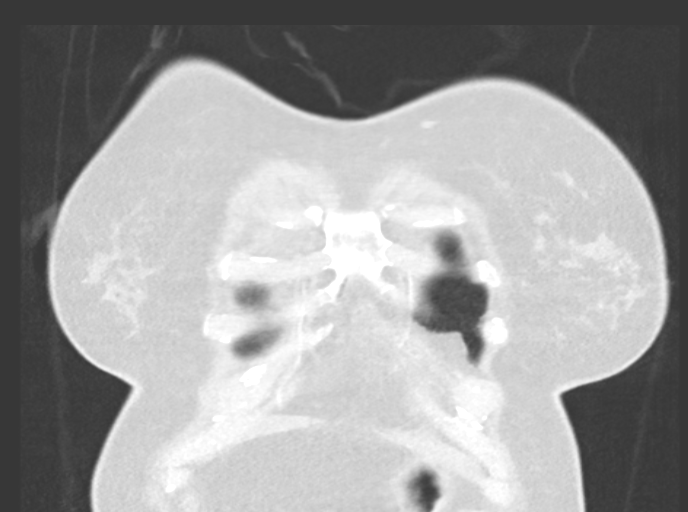
[im 61/151  lung]
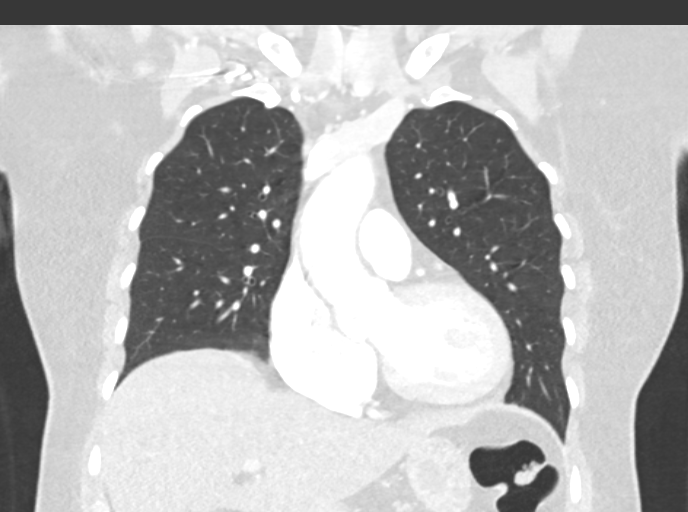
[im 91/151  lung]
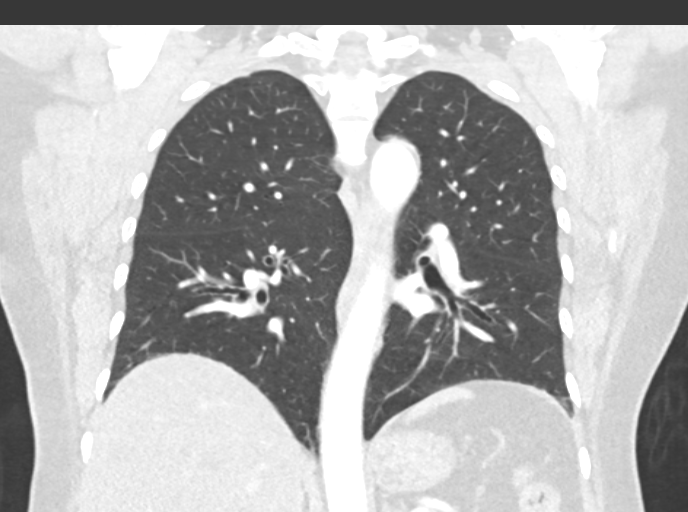

[15 of 36 positions shown; findings below may reference images not displayed]

FINDINGS: Cardiovascular: The heart size is normal.  No pericardial effusion.

Mediastinum/Nodes: No mediastinal lymphadenopathy. There is no hilar
lymphadenopathy. The esophagus has normal imaging features. There is
no axillary lymphadenopathy.

Lungs/Pleura: No focal airspace consolidation. No pulmonary nodule
or mass. Dependent atelectasis noted in the lower lobes, left
greater than right.

Upper Abdomen: The liver shows diffusely decreased attenuation
suggesting steatosis.

Musculoskeletal: Bone windows reveal no worrisome lytic or sclerotic
osseous lesions.
IMPRESSION: 1. No acute cardiopulmonary findings. Specifically, no evidence for
pneumonia.
2. Trace basilar atelectasis in the lower lobes dependently, left
greater than right.

## 2019-08-12 ENCOUNTER — Ambulatory Visit (INDEPENDENT_AMBULATORY_CARE_PROVIDER_SITE_OTHER): Payer: Self-pay | Admitting: Family Medicine

## 2019-08-12 ENCOUNTER — Encounter: Payer: Self-pay | Admitting: Family Medicine

## 2019-08-12 ENCOUNTER — Other Ambulatory Visit: Payer: Self-pay

## 2019-08-12 DIAGNOSIS — M546 Pain in thoracic spine: Secondary | ICD-10-CM

## 2019-08-12 DIAGNOSIS — M79604 Pain in right leg: Secondary | ICD-10-CM

## 2019-08-12 DIAGNOSIS — M79605 Pain in left leg: Secondary | ICD-10-CM

## 2019-08-12 MED ORDER — BACLOFEN 10 MG PO TABS: 5.0000 mg | ORAL_TABLET | Freq: Three times a day (TID) | ORAL | 3 refills | Status: DC | PRN

## 2019-08-12 MED ORDER — CELECOXIB 200 MG PO CAPS
200.0000 mg | ORAL_CAPSULE | Freq: Two times a day (BID) | ORAL | 6 refills | Status: DC | PRN
Start: 2019-08-12 — End: 2021-11-18

## 2019-08-12 NOTE — Patient Instructions (Signed)
    Vitamins for spine health:  - Vitamin D3:  Take 5,000 IU daily  - Vitamin K2:  Take 100 mcg daily  - Magnesium:  Take 400 mg daily  - Glucosamine Sulfate:  1,000 mg twice daily

## 2019-08-12 NOTE — Progress Notes (Signed)
Office Visit Note   Patient: Sara Mora           Date of Birth: 08-14-1966           MRN: [id number] Visit Date: 08/12/2019 Requested by: No referring provider defined for this encounter. PCP: Patient, No Pcp Per  Subjective: Chief Complaint  Patient presents with  . Middle Back - Pain    Pain in middle part of back x years. NKI  . pain in both legs x years    HPI: She is here with an interpreter today.  Her main complaint is bilateral rib cage and mid back pain.  Symptoms started many years ago, no definite injury.  When she was in [place] she was treated with physical therapy which made her pain worse.  She was also given Flexeril and naproxen which caused GI upset.  She has been using ibuprofen with minimal relief.  Pain hurts whenever she moves around, or when she takes a deep breath.  She had x-rays in 2015 which I reviewed on computer showing multilevel degenerative disc disease.  She also complains of bilateral leg pain, not sure if it is related.  Mostly left-sided, occasional numbness.  Denies any bowel or bladder dysfunction.  The family history is negative for known arthritic issues.                ROS: No fevers or chills.  All other systems were reviewed and are negative.  Objective: Vital Signs: There were no vitals taken for this visit.  Physical Exam:  General:  Alert and oriented, in no acute distress. Pulm:  Breathing unlabored. Psy:  Normal mood, congruent affect. Skin: No visible rash. Thoracic spine: No significant tenderness over the spinous processes.  No visible scoliosis.  Pain is not reproducible by palpation of the spine.  There is a little tenderness on the lateral side of her rib cages.  Negative straight leg raise, lower extremity strength and reflexes are normal.  Imaging: None today.  Assessment & Plan: 1.  Chronic thoracic pain, etiology uncertain but could be from myofascial pain.  She does have degenerative disc disease.  -She does not have health insurance right now, we will avoid physical therapy and advanced imaging imaging. -We will try Celebrex, baclofen as needed, vitamin D3 and glucosamine. -If symptoms persist could consider a trial of physical therapy or possibly MRI scan.     Procedures: No procedures performed  No notes on file     PMFS History: Patient Active Problem List   Diagnosis Date Noted  . Type 2 diabetes mellitus ([place]) 10/17/2018  . Cervical lymphadenopathy 04/24/2017  . Headache 04/24/2017  . Herpes labialis 04/24/2017  . Dyslipidemia 03/30/2017  . Hyperglycemia 03/30/2017  . Osteopenia 03/30/2017  . Arthritis 07/31/2016  . Essential hypertension 07/02/2014  . Back muscle spasm 07/02/2014  . BACK PAIN, LUMBAR 12/26/2009  . ONYCHOMYCOSIS 08/25/2009  . CERVICAL STRAIN 08/25/2009  . DEPRESSION 05/13/2009  . MIGRAINE HEADACHE 04/11/2009  . BACK PAIN, THORACIC REGION 04/11/2009  . FATIGUE 04/11/2009  . UNSPECIFIED ABNORMAL MAMMOGRAM 04/11/2009  . ELEVATED BLOOD PRESSURE WITHOUT DIAGNOSIS OF HYPERTENSION 04/11/2009   History reviewed. No pertinent past medical history.  Family History  Problem Relation Age of Onset  . Hypertension Mother     Past Surgical History:  Procedure Laterality Date  . CHOLECYSTECTOMY     Social History   Occupational History  . Not on file  Tobacco Use  . Smoking status: Never Smoker  .  Smokeless tobacco: Never Used  Substance and Sexual Activity  . Alcohol use: No  . Drug use: No  . Sexual activity: Not on file

## 2021-11-18 ENCOUNTER — Other Ambulatory Visit: Payer: Self-pay

## 2021-11-18 ENCOUNTER — Encounter (HOSPITAL_COMMUNITY): Payer: Self-pay

## 2021-11-18 ENCOUNTER — Ambulatory Visit (INDEPENDENT_AMBULATORY_CARE_PROVIDER_SITE_OTHER): Payer: Managed Care, Other (non HMO)

## 2021-11-18 ENCOUNTER — Ambulatory Visit (HOSPITAL_COMMUNITY)
Admission: EM | Admit: 2021-11-18 | Discharge: 2021-11-18 | Disposition: A | Discharge status: Home / Self Care | Payer: Managed Care, Other (non HMO) | Attending: Physician Assistant | Admitting: Physician Assistant

## 2021-11-18 DIAGNOSIS — M79605 Pain in left leg: Secondary | ICD-10-CM | POA: Diagnosis not present

## 2021-11-18 DIAGNOSIS — M25552 Pain in left hip: Secondary | ICD-10-CM

## 2021-11-18 DIAGNOSIS — M25559 Pain in unspecified hip: Secondary | ICD-10-CM

## 2021-11-18 HISTORY — DX: Type 2 diabetes mellitus without complications: E11.9

## 2021-11-18 HISTORY — DX: Essential (primary) hypertension: I10

## 2021-11-18 MED ORDER — BACLOFEN 10 MG PO TABS: 5.0000 mg | ORAL_TABLET | Freq: Every evening | ORAL | 0 refills | Status: DC | PRN

## 2021-11-18 MED ORDER — CELECOXIB 200 MG PO CAPS: 200.0000 mg | ORAL_CAPSULE | Freq: Two times a day (BID) | ORAL | 0 refills | Status: DC | PRN

## 2021-11-18 NOTE — Discharge Instructions (Addendum)
Your x-ray was normal.  Please start Celebrex up to twice a day as needed for pain relief.  Do not take NSAIDs including aspirin, Profen/Advil, naproxen/Aleve.  I have also included baclofen that you can use at night this will make you sleepy.  Use heat, rest, stretch for additional symptom relief.  If symptoms or not improving please follow-up with specialist; call to schedule an appointment.  If you have any severe pain including swelling, redness, difficulty bearing weight, chest pain, shortness of breath, heart racing you need to be seen immediately.

## 2021-11-18 NOTE — ED Triage Notes (Signed)
Pt was triaged using interpreter [phone] [place]. Pt c/o lt thigh pain. Pain is increased with movement and standing. Denies injury.

## 2021-11-18 NOTE — ED Provider Notes (Signed)
[facility]    CSN: [id number] Arrival date & time: 11/18/21  1041      History   Chief Complaint Chief Complaint  Patient presents with   Leg Pain    HPI Sara Mora is a 56 y.o. female.   Patient presents today with several day history of worsening left hip pain.  She is Spanish-speaking and video interpreter is utilized during visit.  She denies any known injury or increase in activity prior to symptom onset.  Reports pain is rated 10 on a 0-10 pain scale, localized to lateral left hip with radiation into thigh, described as intense aching, worse with changing positions or palpation, no alleviating factors identified.  She has tried Tylenol without improvement of symptoms.  She denies previous injury or surgery involving her hip.  She does have a history of chronic back pain but states back pain is not currently bothering her.  She denies any shooting/burning pains.  Denies any bowel/bladder incontinence, lower extremity weakness, saddle anesthesia.  She is having difficulty with daily activities as result of symptoms.  She denies any history of VTE event, malignancy, COVID-19 diagnosis, hospitalization, immobilization, recent surgical procedure, exogenous hormone use.  She denies any significant leg swelling, chest pain, shortness of breath, palpitation.   Past Medical History:  Diagnosis Date   Diabetes mellitus without complication (HCC)    Hypertension     Patient Active Problem List   Diagnosis Date Noted   Type 2 diabetes mellitus (HCC) 10/17/2018   Cervical lymphadenopathy 04/24/2017   Headache 04/24/2017   Herpes labialis 04/24/2017   Dyslipidemia 03/30/2017   Hyperglycemia 03/30/2017   Osteopenia 03/30/2017   Arthritis 07/31/2016   Essential hypertension 07/02/2014   Back muscle spasm 07/02/2014   BACK PAIN, LUMBAR 12/26/2009   ONYCHOMYCOSIS 08/25/2009   CERVICAL STRAIN 08/25/2009   DEPRESSION 05/13/2009   MIGRAINE HEADACHE 04/11/2009    BACK PAIN, THORACIC REGION 04/11/2009   FATIGUE 04/11/2009   UNSPECIFIED ABNORMAL MAMMOGRAM 04/11/2009   ELEVATED BLOOD PRESSURE WITHOUT DIAGNOSIS OF HYPERTENSION 04/11/2009    Past Surgical History:  Procedure Laterality Date   CHOLECYSTECTOMY      OB History   No obstetric history on file.      Home Medications    Prior to Admission medications   Medication Sig Start Date End Date Taking? Authorizing Provider  bisoprolol-hydrochlorothiazide (ZIAC) 5-6.25 MG per tablet Take 1 tablet by mouth daily.   Yes [provider]  Calcium Carbonate-Vitamin D (CALCIUM 500 + D) 500-125 MG-UNIT TABS Take by mouth daily.   Yes [provider]  baclofen (LIORESAL) 10 MG tablet Take 0.5-1 tablets (5-10 mg total) by mouth at bedtime as needed for muscle spasms. 11/18/21   Percilla Tweten, Noberto Retort, PA-C  celecoxib (CELEBREX) 200 MG capsule Take 1 capsule (200 mg total) by mouth 2 (two) times daily as needed. 11/18/21   Melannie Metzner, Noberto Retort, PA-C    Family History Family History  Problem Relation Age of Onset   Hypertension Mother     Social History Social History   Tobacco Use   Smoking status: Never   Smokeless tobacco: Never  Vaping Use   Vaping Use: Never used  Substance Use Topics   Alcohol use: No   Drug use: No     Allergies   Naproxen and Cyclobenzaprine   Review of Systems Review of Systems  Constitutional:  Positive for activity change. Negative for appetite change, fatigue and fever.  Respiratory:  Negative for cough and shortness of  breath.   Cardiovascular:  Negative for chest pain, palpitations and leg swelling.  Gastrointestinal:  Negative for abdominal pain, diarrhea, nausea and vomiting.  Musculoskeletal:  Positive for arthralgias, gait problem and myalgias. Negative for joint swelling.  Neurological:  Negative for dizziness, weakness, light-headedness, numbness and headaches.    Physical Exam Triage Vital Signs ED Triage Vitals  Enc Vitals Group      BP 11/18/21 1243 135/78     Pulse Rate 11/18/21 1243 68     Resp --      Temp 11/18/21 1243 98.4 F (36.9 C)     Temp Source 11/18/21 1243 Oral     SpO2 11/18/21 1243 95 %     Weight --      Height --      Head Circumference --      Peak Flow --      Pain Score 11/18/21 1244 10     Pain Loc --      Pain Edu? --      Excl. in GC? --    No data found.  Updated Vital Signs BP 135/78 (BP Location: Left Arm)    Pulse 68    Temp 98.4 F (36.9 C) (Oral)    SpO2 95%   Visual Acuity Right Eye Distance:   Left Eye Distance:   Bilateral Distance:    Right Eye Near:   Left Eye Near:    Bilateral Near:     Physical Exam Vitals reviewed.  Constitutional:      General: She is awake. She is not in acute distress.    Appearance: Normal appearance. She is well-developed. She is not ill-appearing.     Comments: Very pleasant female appears stated age in no acute distress sitting comfortably in exam room  HENT:     Head: Normocephalic and atraumatic.  Cardiovascular:     Rate and Rhythm: Normal rate and regular rhythm.     Pulses:          Posterior tibial pulses are 2+ on the right side and 2+ on the left side.     Heart sounds: Normal heart sounds, S1 normal and S2 normal. No murmur heard.    Comments: Negative Homan on left Pulmonary:     Effort: Pulmonary effort is normal.     Breath sounds: Normal breath sounds. No wheezing, rhonchi or rales.  Abdominal:     Palpations: Abdomen is soft.     Tenderness: There is no abdominal tenderness.  Musculoskeletal:     Left hip: Tenderness and bony tenderness present. Normal range of motion. Normal strength.     Left upper leg: Tenderness present. No swelling or bony tenderness.     Right lower leg: No edema.     Left lower leg: No edema.  Psychiatric:        Behavior: Behavior is cooperative.     UC Treatments / Results  Labs (all labs ordered are listed, but only abnormal results are displayed) Labs Reviewed - No data to  display  EKG   Radiology DG Hip Unilat With Pelvis 2-3 Views Left  Result Date: 11/18/2021 CLINICAL DATA:  LEFT thigh pain, increased with movement and standing. No known injury. EXAM: DG HIP (WITH OR WITHOUT PELVIS) 2-3V LEFT COMPARISON:  None. FINDINGS: There is no evidence of hip fracture or dislocation. There is no evidence of arthropathy or other focal bone abnormality. IMPRESSION: Negative. Electronically Signed   By: Norva Pavlov M.D.   On: 11/18/2021  13:19    Procedures Procedures (including critical care time)  Medications Ordered in UC Medications - No data to display  Initial Impression / Assessment and Plan / UC Course  I have reviewed the triage vital signs and the nursing notes.  Pertinent labs & imaging results that were available during my care of the patient were reviewed by me and considered in my medical decision making (see chart for details).     X-ray was obtained given severity of pain which showed no osseous abnormality.  Concern for muscular etiology given presentation tenderness on exam.  Low suspicion for DVT based on -2 point Wells DVT score.  We will start Celebrex given difficulty tolerating other NSAIDs due to GI side effects in the past.  She has safely taken Celebrex in the past.  She was instructed not to take additional NSAIDs with the medication.  She was given baclofen to be used at night with instruction not to drive or drink alcohol with taking this medication.  Recommended she use heat, rest, stretch for additional symptom relief.  Discussed that if symptoms are not improving she should follow-up with orthopedics and was given contact information for local provider with instruction to call to schedule appointment if symptoms have not improved over the weekend.  Discussed alarm symptoms that warrant emergent evaluation.  Strict return precautions given to which she expressed understanding.  Final Clinical Impressions(s) / UC Diagnoses   Final  diagnoses:  Hip pain  Left leg pain     Discharge Instructions      Your x-ray was normal.  Please start Celebrex up to twice a day as needed for pain relief.  Do not take NSAIDs including aspirin, Profen/Advil, naproxen/Aleve.  I have also included baclofen that you can use at night this will make you sleepy.  Use heat, rest, stretch for additional symptom relief.  If symptoms or not improving please follow-up with specialist; call to schedule an appointment.  If you have any severe pain including swelling, redness, difficulty bearing weight, chest pain, shortness of breath, heart racing you need to be seen immediately.     ED Prescriptions     Medication Sig Dispense Auth. Provider   celecoxib (CELEBREX) 200 MG capsule Take 1 capsule (200 mg total) by mouth 2 (two) times daily as needed. 20 capsule Leo Weyandt K, PA-C   baclofen (LIORESAL) 10 MG tablet Take 0.5-1 tablets (5-10 mg total) by mouth at bedtime as needed for muscle spasms. 10 tablet Alexandros Ewan, Noberto Retort, PA-C      PDMP not reviewed this encounter.   Jeani Hawking, PA-C 11/18/21 1341

## 2021-11-29 ENCOUNTER — Other Ambulatory Visit: Payer: Self-pay

## 2021-11-29 ENCOUNTER — Encounter: Payer: Self-pay | Admitting: Nurse Practitioner

## 2021-11-29 ENCOUNTER — Ambulatory Visit: Payer: Managed Care, Other (non HMO) | Attending: Nurse Practitioner | Admitting: Nurse Practitioner

## 2021-11-29 VITALS — BP 129/79 | HR 52 | Resp 18 | Ht 62.0 in | Wt 162.5 lb

## 2021-11-29 DIAGNOSIS — I10 Essential (primary) hypertension: Secondary | ICD-10-CM

## 2021-11-29 DIAGNOSIS — Z114 Encounter for screening for human immunodeficiency virus [HIV]: Secondary | ICD-10-CM

## 2021-11-29 DIAGNOSIS — Z1211 Encounter for screening for malignant neoplasm of colon: Secondary | ICD-10-CM

## 2021-11-29 DIAGNOSIS — Z7689 Persons encountering health services in other specified circumstances: Secondary | ICD-10-CM

## 2021-11-29 DIAGNOSIS — Z1159 Encounter for screening for other viral diseases: Secondary | ICD-10-CM

## 2021-11-29 DIAGNOSIS — Z130 Encounter for screening for diseases of the blood and blood-forming organs and certain disorders involving the immune mechanism: Secondary | ICD-10-CM

## 2021-11-29 DIAGNOSIS — R7303 Prediabetes: Secondary | ICD-10-CM

## 2021-11-29 DIAGNOSIS — Z1231 Encounter for screening mammogram for malignant neoplasm of breast: Secondary | ICD-10-CM

## 2021-11-29 DIAGNOSIS — E782 Mixed hyperlipidemia: Secondary | ICD-10-CM

## 2021-11-29 DIAGNOSIS — M159 Polyosteoarthritis, unspecified: Secondary | ICD-10-CM

## 2021-11-29 MED ORDER — MELOXICAM 7.5 MG PO TABS: 7.5000 mg | ORAL_TABLET | Freq: Every day | ORAL | 0 refills | Status: DC

## 2021-11-29 MED ORDER — METFORMIN HCL 500 MG PO TABS: 500.0000 mg | ORAL_TABLET | Freq: Two times a day (BID) | ORAL | 1 refills | Status: DC

## 2021-11-29 MED ORDER — BACLOFEN 10 MG PO TABS: 5.0000 mg | ORAL_TABLET | Freq: Every evening | ORAL | 3 refills | Status: DC | PRN

## 2021-11-29 MED ORDER — BISOPROLOL-HYDROCHLOROTHIAZIDE 5-6.25 MG PO TABS: 1.0000 | ORAL_TABLET | Freq: Every day | ORAL | 1 refills | Status: DC

## 2021-11-29 MED ORDER — ATORVASTATIN CALCIUM 10 MG PO TABS: 10.0000 mg | ORAL_TABLET | Freq: Every day | ORAL | 3 refills | Status: DC

## 2021-11-29 NOTE — Progress Notes (Signed)
? ?Assessment & Plan:  ?Sara Mora was seen today for establish care. ? ?Diagnoses and all orders for this visit: ? ?Encounter to establish care ? ?Colon cancer screening ?-     Ambulatory referral to Gastroenterology ? ?Mixed hyperlipidemia ?-     Lipid panel ?-     atorvastatin (LIPITOR) 10 MG tablet; Take 1 tablet (10 mg total) by mouth daily. ? ?Essential hypertension ?-     CMP14+EGFR ?-     bisoprolol-hydrochlorothiazide (ZIAC) 5-6.25 MG tablet; Take 1 tablet by mouth daily. ? ?Prediabetes ?-     Hemoglobin A1c ?-     metFORMIN (GLUCOPHAGE) 500 MG tablet; Take 1 tablet (500 mg total) by mouth 2 (two) times daily with a meal. ? ?Encounter for screening mammogram for malignant neoplasm of breast ?-     MM DIGITAL SCREENING BILATERAL; Future ? ?Encounter for screening for HIV ?-     HIV antibody (with reflex) ? ?Need for hepatitis C screening test ?-     HCV Ab w Reflex to Quant PCR ? ?Screening for deficiency anemia ?-     CBC ? ?Primary osteoarthritis involving multiple joints ?-     meloxicam (MOBIC) 7.5 MG tablet; Take 1 tablet (7.5 mg total) by mouth daily. ?-     baclofen (LIORESAL) 10 MG tablet; Take 0.5-1 tablets (5-10 mg total) by mouth at bedtime as needed for muscle spasms. ?Work on losing weight to help reduce back pain. May alternate with heat and ice application for pain relief. May also alternate with acetaminophen  as prescribed for back pain. Other alternatives include massage, acupuncture and water aerobics.  You must stay active and avoid a sedentary lifestyle. ?  ? ? ? ?Patient has been counseled on age-appropriate routine health concerns for screening and prevention. These are reviewed and up-to-date. Referrals have been placed accordingly. Immunizations are up-to-date or declined.    ?Subjective:  ? ?Chief Complaint  ?Patient presents with  ? Establish Care  ? ?HPI ?Sara Mora 56 y.o. female presents to office today to establish care. ?She has a past medical history of  hypertension, dyslipidemia, osteoarthritis and prediabetes. ? ?Last Mammogram and PAP smear were both performed last year 2022 in [place].  ? ?Prediabetes ?Currently taking metformin 500 mg BID.  Well-controlled at this time.  LDL not at goal however she endorses adherence taking atorvastatin 10 mg daily.  We will repeat lipid panel today and if elevated will need to increase atorvastatin. ?Lab Results  ?Component Value Date  ? HGBA1C 5.80 01/14/2015  ?  ?Lab Results  ?Component Value Date  ? [place] 85 07/02/2014  ?  ?HTN ?Blood pressure is well controlled with Ziac 5-6.25 mg daily. ?BP Readings from Last 3 Encounters:  ?11/29/21 129/79  ?11/18/21 135/78  ?05/06/17 118/69  ?  ?OA ?She has chronic thoracic and low back with bilateral hip pain.  Most recent hip x-rays November 13, 2018 did not show any significant degenerative changes.  She has been prescribed Celebrex in the past for orthopedics.  I will be switching her to meloxicam today.  She denies any symptoms of sciatica or radiculopathy at this time. ? ?Review of Systems  ?Constitutional:  Negative for fever, malaise/fatigue and weight loss.  ?HENT: Negative.  Negative for nosebleeds.   ?Eyes: Negative.  Negative for blurred vision, double vision and photophobia.  ?Respiratory: Negative.  Negative for cough and shortness of breath.   ?Cardiovascular: Negative.  Negative for chest pain, palpitations and leg swelling.  ?Gastrointestinal: Negative.  Negative for heartburn, nausea and vomiting.  ?Musculoskeletal:  Positive for back pain, joint pain and myalgias.  ?Neurological: Negative.  Negative for dizziness, focal weakness, seizures and headaches.  ?Psychiatric/Behavioral: Negative.  Negative for suicidal ideas.   ? ?Past Medical History:  ?Diagnosis Date  ? Diabetes mellitus without complication ([place])   ? Hypertension   ? ? ?Past Surgical History:  ?Procedure Laterality Date  ? CHOLECYSTECTOMY    ? ? ?Family History  ?Problem Relation Age of Onset  ?  Hypertension Mother   ? ? ?Social History Reviewed with no changes to be made today.  ? ?Outpatient Medications Prior to Visit  ?Medication Sig Dispense Refill  ? Calcium Carbonate-Vitamin D (CALCIUM 500 + D) 500-125 MG-UNIT TABS Take by mouth daily.    ? bisoprolol-hydrochlorothiazide (ZIAC) 5-6.25 MG per tablet Take 1 tablet by mouth daily.    ? atorvastatin (LIPITOR) 10 MG tablet Take 10 mg by mouth daily. (Patient not taking: Reported on 11/29/2021)    ? baclofen (LIORESAL) 10 MG tablet Take 0.5-1 tablets (5-10 mg total) by mouth at bedtime as needed for muscle spasms. (Patient not taking: Reported on 11/29/2021) 10 tablet 0  ? celecoxib (CELEBREX) 200 MG capsule Take 1 capsule (200 mg total) by mouth 2 (two) times daily as needed. (Patient not taking: Reported on 11/29/2021) 20 capsule 0  ? metFORMIN (GLUCOPHAGE) 500 MG tablet Take by mouth 2 (two) times daily with a meal. (Patient not taking: Reported on 11/29/2021)    ? ?No facility-administered medications prior to visit.  ? ? ?Allergies  ?Allergen Reactions  ? Naproxen   ?  Intolerant, gastritis   ? Cyclobenzaprine Nausea Only  ? ? ?   ?Objective:  ?  ?BP 129/79   Pulse (!) 52   Resp 18   Ht $R'5\' 2"'gK$  (1.575 m)   Wt 162 lb 8 oz (73.7 kg)   SpO2 99%   BMI 29.72 kg/m?  ?Wt Readings from Last 3 Encounters:  ?11/29/21 162 lb 8 oz (73.7 kg)  ?05/06/17 155 lb (70.3 kg)  ?01/14/15 162 lb 12.8 oz (73.8 kg)  ? ? ?Physical Exam ?Vitals and nursing note reviewed.  ?Constitutional:   ?   Appearance: She is well-developed.  ?HENT:  ?   Head: Normocephalic and atraumatic.  ?Cardiovascular:  ?   Rate and Rhythm: Normal rate and regular rhythm.  ?   Heart sounds: Normal heart sounds. No murmur heard. ?  No friction rub. No gallop.  ?Pulmonary:  ?   Effort: Pulmonary effort is normal. No tachypnea or respiratory distress.  ?   Breath sounds: Normal breath sounds. No decreased breath sounds, wheezing, rhonchi or rales.  ?Chest:  ?   Chest wall: No tenderness.  ?Abdominal:  ?    General: Bowel sounds are normal.  ?   Palpations: Abdomen is soft.  ?Musculoskeletal:     ?   General: Normal range of motion.  ?   Cervical back: Normal range of motion.  ?Skin: ?   General: Skin is warm and dry.  ?Neurological:  ?   Mental Status: She is alert and oriented to person, place, and time.  ?   Coordination: Coordination normal.  ?Psychiatric:     ?   Behavior: Behavior normal. Behavior is cooperative.     ?   Thought Content: Thought content normal.     ?   Judgment: Judgment normal.  ? ? ? ? ?   ?Patient has been counseled extensively about nutrition  and exercise as well as the importance of adherence with medications and regular follow-up. The patient was given clear instructions to go to ER or return to medical center if symptoms don't improve, worsen or new problems develop. The patient verbalized understanding.  ? ?Follow-up: Return in about 3 months (around 03/01/2022).  ? ?Gildardo Pounds, FNP-BC ?[place] ?[place], [place] ?[phone]   ?11/29/2021, 9:53 AM ?

## 2021-11-30 LAB — CBC
Hematocrit: 42.8 % (ref 34.0–46.6)
Hemoglobin: 14.1 g/dL (ref 11.1–15.9)
MCH: 30.9 pg (ref 26.6–33.0)
MCHC: 32.9 g/dL (ref 31.5–35.7)
MCV: 94 fL (ref 79–97)
Platelets: 268 10*3/uL (ref 150–450)
RBC: 4.56 x10E6/uL (ref 3.77–5.28)
RDW: 12.9 % (ref 11.7–15.4)
WBC: 6.1 10*3/uL (ref 3.4–10.8)

## 2021-11-30 LAB — LIPID PANEL
Chol/HDL Ratio: 2.9 ratio (ref 0.0–4.4)
Cholesterol, Total: 109 mg/dL (ref 100–199)
HDL: 38 mg/dL — ABNORMAL LOW (ref >39)
LDL Chol Calc (NIH): 50 mg/dL (ref 0–99)
Triglycerides: 115 mg/dL (ref 0–149)
VLDL Cholesterol Cal: 21 mg/dL (ref 5–40)

## 2021-11-30 LAB — CMP14+EGFR
ALT: 21 IU/L (ref 0–32)
AST: 22 IU/L (ref 0–40)
Albumin/Globulin Ratio: 2.2 (ref 1.2–2.2)
Albumin: 5.2 g/dL — ABNORMAL HIGH (ref 3.8–4.9)
Alkaline Phosphatase: 100 IU/L (ref 44–121)
BUN/Creatinine Ratio: 13 (ref 9–23)
BUN: 9 mg/dL (ref 6–24)
Bilirubin Total: 0.9 mg/dL (ref 0.0–1.2)
CO2: 22 mmol/L (ref 20–29)
Calcium: 10.3 mg/dL — ABNORMAL HIGH (ref 8.7–10.2)
Chloride: 101 mmol/L (ref 96–106)
Creatinine, Ser: 0.71 mg/dL (ref 0.57–1.00)
Globulin, Total: 2.4 g/dL (ref 1.5–4.5)
Glucose: 107 mg/dL — ABNORMAL HIGH (ref 70–99)
Potassium: 4.4 mmol/L (ref 3.5–5.2)
Sodium: 139 mmol/L (ref 134–144)
Total Protein: 7.6 g/dL (ref 6.0–8.5)
eGFR: 100 mL/min/{1.73_m2} (ref >59)

## 2021-11-30 LAB — HCV AB W REFLEX TO QUANT PCR: HCV Ab: NONREACTIVE

## 2021-11-30 LAB — HCV INTERPRETATION

## 2021-11-30 LAB — HEMOGLOBIN A1C
Est. average glucose Bld gHb Est-mCnc: 123 mg/dL
Hgb A1c MFr Bld: 5.9 % — ABNORMAL HIGH (ref 4.8–5.6)

## 2021-11-30 LAB — HIV ANTIBODY (ROUTINE TESTING W REFLEX): HIV Screen 4th Generation wRfx: NONREACTIVE

## 2021-12-05 ENCOUNTER — Telehealth: Payer: Self-pay

## 2021-12-05 NOTE — Telephone Encounter (Signed)
Left message to return call to our office. With the help of Mardee Postin # [phone] ?

## 2021-12-07 ENCOUNTER — Telehealth: Payer: Self-pay

## 2021-12-07 ENCOUNTER — Telehealth: Payer: Self-pay | Admitting: *Deleted

## 2021-12-07 NOTE — Telephone Encounter (Signed)
Via interpreter Amalia Hailey [phone] I gave results and provider comments. No questions asked. ?

## 2021-12-07 NOTE — Telephone Encounter (Signed)
Left message to return call to our office. With the help of Hollice Gong # [phone]  ?

## 2022-03-02 ENCOUNTER — Encounter: Payer: Self-pay | Admitting: Nurse Practitioner

## 2022-03-02 ENCOUNTER — Ambulatory Visit: Payer: Commercial Managed Care - HMO | Attending: Nurse Practitioner | Admitting: Nurse Practitioner

## 2022-03-02 VITALS — BP 120/77 | HR 53 | Ht 62.0 in | Wt 164.4 lb

## 2022-03-02 DIAGNOSIS — R7303 Prediabetes: Secondary | ICD-10-CM

## 2022-03-02 DIAGNOSIS — I10 Essential (primary) hypertension: Secondary | ICD-10-CM | POA: Diagnosis not present

## 2022-03-02 DIAGNOSIS — E782 Mixed hyperlipidemia: Secondary | ICD-10-CM

## 2022-03-02 MED ORDER — BISOPROLOL-HYDROCHLOROTHIAZIDE 5-6.25 MG PO TABS: 1.0000 | ORAL_TABLET | Freq: Every day | ORAL | 1 refills | Status: DC

## 2022-03-02 MED ORDER — ATORVASTATIN CALCIUM 10 MG PO TABS: 10.0000 mg | ORAL_TABLET | Freq: Every day | ORAL | 3 refills | Status: DC

## 2022-03-02 NOTE — Progress Notes (Signed)
Assessment & Plan:  Merrissa was seen today for hypertension.  Diagnoses and all orders for this visit:  Essential hypertension -     CMP14+EGFR -     bisoprolol-hydrochlorothiazide (ZIAC) 5-6.25 MG tablet; Take 1 tablet by mouth daily.  Prediabetes -     Hemoglobin A1c  Mixed hyperlipidemia -     atorvastatin (LIPITOR) 10 MG tablet; Take 1 tablet (10 mg total) by mouth daily.    Patient has been counseled on age-appropriate routine health concerns for screening and prevention. These are reviewed and up-to-date. Referrals have been placed accordingly. Immunizations are up-to-date or declined.    Subjective:   Chief Complaint  Patient presents with   Hypertension   HPI Sara Mora 56 y.o. female presents to office today for follow up to HTN VRI was used to communicate directly with patient for the entire encounter including providing detailed patient instructions.      HTN Blood pressure is well controlled. She is taking ziac 5-6.25 mg daily. She does not monitor her blood pressure at home.  BP Readings from Last 3 Encounters:  03/02/22 120/77  11/29/21 129/79  11/18/21 135/78     OA She has chronic thoracic and low back with bilateral hip pain.  Most recent hip x-rays November 13, 2018 did not show any significant degenerative changes.  She has been prescribed Celebrex in the past for orthopedics.  I will be switching her to meloxicam today.  She denies any symptoms of sciatica or radiculopathy at this time.  She states when she takes meloxicam and baclofen it makes her sleepy and gives her a headache. However she declines alternatives today. Prefers to stay on baclofen and meloxicam.    Review of Systems  Constitutional:  Negative for fever, malaise/fatigue and weight loss.  HENT: Negative.  Negative for nosebleeds.   Eyes: Negative.  Negative for blurred vision, double vision and photophobia.  Respiratory: Negative.  Negative for cough and  shortness of breath.   Cardiovascular: Negative.  Negative for chest pain, palpitations and leg swelling.  Gastrointestinal: Negative.  Negative for heartburn, nausea and vomiting.  Musculoskeletal:  Positive for back pain, joint pain and myalgias.  Neurological: Negative.  Negative for dizziness, focal weakness, seizures and headaches.  Psychiatric/Behavioral: Negative.  Negative for suicidal ideas.     Past Medical History:  Diagnosis Date   Hypertension    Prediabetes     Past Surgical History:  Procedure Laterality Date   CHOLECYSTECTOMY      Family History  Problem Relation Age of Onset   Hypertension Mother     Social History Reviewed with no changes to be made today.   Outpatient Medications Prior to Visit  Medication Sig Dispense Refill   baclofen (LIORESAL) 10 MG tablet Take 0.5-1 tablets (5-10 mg total) by mouth at bedtime as needed for muscle spasms. 30 tablet 3   Calcium Carbonate-Vitamin D (CALCIUM 500 + D) 500-125 MG-UNIT TABS Take by mouth daily.     meloxicam (MOBIC) 7.5 MG tablet Take 1 tablet (7.5 mg total) by mouth daily. 30 tablet 0   metFORMIN (GLUCOPHAGE) 500 MG tablet Take 1 tablet (500 mg total) by mouth 2 (two) times daily with a meal. 180 tablet 1   atorvastatin (LIPITOR) 10 MG tablet Take 1 tablet (10 mg total) by mouth daily. 90 tablet 3   bisoprolol-hydrochlorothiazide (ZIAC) 5-6.25 MG tablet Take 1 tablet by mouth daily. 90 tablet 1   No facility-administered medications prior to visit.  Allergies  Allergen Reactions   Naproxen     Intolerant, gastritis    Cyclobenzaprine Nausea Only       Objective:    BP 120/77   Pulse (!) 53   Ht $R'5\' 2"'Rr$  (1.575 m)   Wt 164 lb 6.4 oz (74.6 kg)   SpO2 98%   BMI 30.07 kg/m  Wt Readings from Last 3 Encounters:  03/02/22 164 lb 6.4 oz (74.6 kg)  11/29/21 162 lb 8 oz (73.7 kg)  05/06/17 155 lb (70.3 kg)    Physical Exam Vitals and nursing note reviewed.  Constitutional:      Appearance: She is  well-developed.  HENT:     Head: Normocephalic and atraumatic.  Cardiovascular:     Rate and Rhythm: Normal rate and regular rhythm.     Heart sounds: Normal heart sounds. No murmur heard.    No friction rub. No gallop.  Pulmonary:     Effort: Pulmonary effort is normal. No tachypnea or respiratory distress.     Breath sounds: Normal breath sounds. No decreased breath sounds, wheezing, rhonchi or rales.  Chest:     Chest wall: No tenderness.  Abdominal:     General: Bowel sounds are normal.     Palpations: Abdomen is soft.  Musculoskeletal:        General: Normal range of motion.     Cervical back: Normal range of motion.  Skin:    General: Skin is warm and dry.  Neurological:     Mental Status: She is alert and oriented to person, place, and time.     Coordination: Coordination normal.  Psychiatric:        Behavior: Behavior normal. Behavior is cooperative.        Thought Content: Thought content normal.        Judgment: Judgment normal.          Patient has been counseled extensively about nutrition and exercise as well as the importance of adherence with medications and regular follow-up. The patient was given clear instructions to go to ER or return to medical center if symptoms don't improve, worsen or new problems develop. The patient verbalized understanding.   Follow-up: Return in about 3 months (around 06/02/2022).   Gildardo Pounds, FNP-BC [facility] [place], [place]   03/02/2022, 9:24 AM

## 2022-03-02 NOTE — Progress Notes (Signed)
No taking BP medication daily states make her head hurt.

## 2022-03-03 LAB — CMP14+EGFR
ALT: 19 IU/L (ref 0–32)
AST: 19 IU/L (ref 0–40)
Albumin/Globulin Ratio: 1.7 (ref 1.2–2.2)
Albumin: 4.9 g/dL (ref 3.8–4.9)
Alkaline Phosphatase: 93 IU/L (ref 44–121)
BUN/Creatinine Ratio: 13 (ref 9–23)
BUN: 10 mg/dL (ref 6–24)
Bilirubin Total: 1.1 mg/dL (ref 0.0–1.2)
CO2: 24 mmol/L (ref 20–29)
Calcium: 10 mg/dL (ref 8.7–10.2)
Chloride: 104 mmol/L (ref 96–106)
Creatinine, Ser: 0.77 mg/dL (ref 0.57–1.00)
Globulin, Total: 2.9 g/dL (ref 1.5–4.5)
Glucose: 113 mg/dL — ABNORMAL HIGH (ref 70–99)
Potassium: 4.5 mmol/L (ref 3.5–5.2)
Sodium: 140 mmol/L (ref 134–144)
Total Protein: 7.8 g/dL (ref 6.0–8.5)
eGFR: 91 mL/min/{1.73_m2} (ref >59)

## 2022-03-03 LAB — HEMOGLOBIN A1C
Est. average glucose Bld gHb Est-mCnc: 123 mg/dL
Hgb A1c MFr Bld: 5.9 % — ABNORMAL HIGH (ref 4.8–5.6)

## 2022-06-11 ENCOUNTER — Ambulatory Visit: Payer: Commercial Managed Care - HMO | Attending: Nurse Practitioner | Admitting: Nurse Practitioner

## 2022-06-11 ENCOUNTER — Encounter: Payer: Self-pay | Admitting: Nurse Practitioner

## 2022-06-11 VITALS — BP 144/84 | HR 60 | Temp 98.1°F | Ht 62.0 in

## 2022-06-11 DIAGNOSIS — Z23 Encounter for immunization: Secondary | ICD-10-CM | POA: Diagnosis not present

## 2022-06-11 DIAGNOSIS — M150 Primary generalized (osteo)arthritis: Secondary | ICD-10-CM

## 2022-06-11 DIAGNOSIS — I10 Essential (primary) hypertension: Secondary | ICD-10-CM | POA: Diagnosis not present

## 2022-06-11 DIAGNOSIS — R7303 Prediabetes: Secondary | ICD-10-CM | POA: Diagnosis not present

## 2022-06-11 DIAGNOSIS — M159 Polyosteoarthritis, unspecified: Secondary | ICD-10-CM | POA: Diagnosis not present

## 2022-06-11 DIAGNOSIS — E782 Mixed hyperlipidemia: Secondary | ICD-10-CM

## 2022-06-11 DIAGNOSIS — Z1211 Encounter for screening for malignant neoplasm of colon: Secondary | ICD-10-CM

## 2022-06-11 MED ORDER — MELOXICAM 7.5 MG PO TABS: 7.5000 mg | ORAL_TABLET | Freq: Every day | ORAL | 1 refills | Status: AC

## 2022-06-11 MED ORDER — METFORMIN HCL 500 MG PO TABS: 500.0000 mg | ORAL_TABLET | Freq: Two times a day (BID) | ORAL | 1 refills | Status: AC

## 2022-06-11 MED ORDER — BACLOFEN 10 MG PO TABS: 5.0000 mg | ORAL_TABLET | Freq: Every evening | ORAL | 3 refills | Status: AC | PRN

## 2022-06-11 MED ORDER — ATORVASTATIN CALCIUM 10 MG PO TABS: 10.0000 mg | ORAL_TABLET | Freq: Every day | ORAL | 3 refills | Status: AC

## 2022-06-11 MED ORDER — BISOPROLOL-HYDROCHLOROTHIAZIDE 5-6.25 MG PO TABS: 1.0000 | ORAL_TABLET | Freq: Every day | ORAL | 1 refills | Status: AC

## 2022-06-11 NOTE — Addendum Note (Signed)
Addended by: Gasper Lloyd on: 06/11/2022 09:49 AM   Modules accepted: Orders

## 2022-06-11 NOTE — Progress Notes (Signed)
Assessment & Plan:  Sara Mora was seen today for hypertension.  Diagnoses and all orders for this visit:  Essential hypertension -     Basic metabolic panel -     bisoprolol-hydrochlorothiazide (ZIAC) 5-6.25 MG tablet; Take 1 tablet by mouth daily. Please fill as a 90 day supply  Colon cancer screening -     Ambulatory referral to Gastroenterology  Primary osteoarthritis involving multiple joints -     baclofen (LIORESAL) 10 MG tablet; Take 0.5-1 tablets (5-10 mg total) by mouth at bedtime as needed for muscle spasms. Please fill as a 90 day supply -     meloxicam (MOBIC) 7.5 MG tablet; Take 1 tablet (7.5 mg total) by mouth daily. Please fill as a 90 day supply  Mixed hyperlipidemia -     atorvastatin (LIPITOR) 10 MG tablet; Take 1 tablet (10 mg total) by mouth daily. Please fill as a 90 day supply  Prediabetes Well controlled> We discussed most current lab results today in office  -     metFORMIN (GLUCOPHAGE) 500 MG tablet; Take 1 tablet (500 mg total) by mouth 2 (two) times daily with a meal. Please fill as a 90 day supply    Patient has been counseled on age-appropriate routine health concerns for screening and prevention. These are reviewed and up-to-date. Referrals have been placed accordingly. Immunizations are up-to-date or declined.    Subjective:   Chief Complaint  Patient presents with   Hypertension   Hypertension Pertinent negatives include no blurred vision, chest pain, headaches, malaise/fatigue, palpitations or shortness of breath.   Sara Mora 56 y.o. female presents to office today for follow up to HTN  VRI was used to communicate directly with patient for the entire encounter including providing detailed patient instructions.    She has a past medical history of Hypertension, HPL with elevated triglycerides, chronic back and hip pain, and Prediabetes.   Patient has been counseled on age-appropriate routine health concerns for screening  and prevention. These are reviewed and up-to-date. Referrals have been placed accordingly. Immunizations are up-to-date or declined.     MAMMOGRAM: Had mammogram in August in [place]. Reports normal.  PAP Smear: Performed in August in [place].  COLONOSCOPY: Overdue. ordered today   HTN Blood pressure slightly elevated. She reports not taking her blood pressure medication today. Blood pressures usually run normal.  BP Readings from Last 3 Encounters:  06/11/22 (!) 144/84  03/02/22 120/77  11/29/21 129/79     OA She has chronic thoracic and low back with bilateral hip pain.  Most recent hip x-rays November 13, 2018 did not show any significant degenerative changes.  She has been prescribed Celebrex in the past for orthopedics.  I will be switching her to meloxicam today.  She denies any symptoms of sciatica or radiculopathy at this time.  She states when she takes meloxicam and baclofen it makes her sleepy and gives her a headache. However she declines alternatives today. Per Ortho Note 08-12-2019 Prefers to stay on baclofen and meloxicam. When she was in [place] she was treated with physical therapy which made her pain worse.  She was also given Flexeril and naproxen which caused GI upset.  She has been using ibuprofen with minimal relief.      Review of Systems  Constitutional:  Negative for fever, malaise/fatigue and weight loss.  HENT: Negative.  Negative for nosebleeds.   Eyes: Negative.  Negative for blurred vision, double vision and photophobia.  Respiratory: Negative.  Negative for cough and  shortness of breath.   Cardiovascular: Negative.  Negative for chest pain, palpitations and leg swelling.  Gastrointestinal: Negative.  Negative for heartburn, nausea and vomiting.  Musculoskeletal: Negative.  Negative for myalgias.  Neurological: Negative.  Negative for dizziness, focal weakness, seizures and headaches.  Psychiatric/Behavioral: Negative.  Negative for suicidal ideas.     Past  Medical History:  Diagnosis Date   Hypertension    Prediabetes     Past Surgical History:  Procedure Laterality Date   CHOLECYSTECTOMY      Family History  Problem Relation Age of Onset   Hypertension Mother     Social History Reviewed with no changes to be made today.   Outpatient Medications Prior to Visit  Medication Sig Dispense Refill   Calcium Carbonate-Vitamin D (CALCIUM 500 + D) 500-125 MG-UNIT TABS Take by mouth daily.     atorvastatin (LIPITOR) 10 MG tablet Take 1 tablet (10 mg total) by mouth daily. 90 tablet 3   baclofen (LIORESAL) 10 MG tablet Take 0.5-1 tablets (5-10 mg total) by mouth at bedtime as needed for muscle spasms. 30 tablet 3   bisoprolol-hydrochlorothiazide (ZIAC) 5-6.25 MG tablet Take 1 tablet by mouth daily. 90 tablet 1   meloxicam (MOBIC) 7.5 MG tablet Take 1 tablet (7.5 mg total) by mouth daily. 30 tablet 0   metFORMIN (GLUCOPHAGE) 500 MG tablet Take 1 tablet (500 mg total) by mouth 2 (two) times daily with a meal. 180 tablet 1   No facility-administered medications prior to visit.    Allergies  Allergen Reactions   Naproxen     Intolerant, gastritis    Cyclobenzaprine Nausea Only       Objective:    BP (!) 144/84   Pulse 60   Temp 98.1 F (36.7 C) (Oral)   Ht 5\' 2"  (1.575 m)   SpO2 98%   BMI 30.07 kg/m  Wt Readings from Last 3 Encounters:  03/02/22 164 lb 6.4 oz (74.6 kg)  11/29/21 162 lb 8 oz (73.7 kg)  05/06/17 155 lb (70.3 kg)    Physical Exam Vitals and nursing note reviewed.  Constitutional:      Appearance: She is well-developed.  HENT:     Head: Normocephalic and atraumatic.  Cardiovascular:     Rate and Rhythm: Normal rate and regular rhythm.     Heart sounds: Normal heart sounds. No murmur heard.    No friction rub. No gallop.  Pulmonary:     Effort: Pulmonary effort is normal. No tachypnea or respiratory distress.     Breath sounds: Normal breath sounds. No decreased breath sounds, wheezing, rhonchi or rales.   Chest:     Chest wall: No tenderness.  Abdominal:     General: Bowel sounds are normal.     Palpations: Abdomen is soft.  Musculoskeletal:        General: Normal range of motion.     Cervical back: Normal range of motion.  Skin:    General: Skin is warm and dry.  Neurological:     Mental Status: She is alert and oriented to person, place, and time.     Coordination: Coordination normal.  Psychiatric:        Behavior: Behavior normal. Behavior is cooperative.        Thought Content: Thought content normal.        Judgment: Judgment normal.          Patient has been counseled extensively about nutrition and exercise as well as the importance of adherence  with medications and regular follow-up. The patient was given clear instructions to go to ER or return to medical center if symptoms don't improve, worsen or new problems develop. The patient verbalized understanding.   Follow-up: Return in about 3 months (around 09/10/2022) for htn.   Gildardo Pounds, FNP-BC [facility] [place], [place]   06/11/2022, 9:04 AM

## 2022-06-12 LAB — BASIC METABOLIC PANEL
BUN/Creatinine Ratio: 14 (ref 9–23)
BUN: 11 mg/dL (ref 6–24)
CO2: 22 mmol/L (ref 20–29)
Calcium: 9.9 mg/dL (ref 8.7–10.2)
Chloride: 104 mmol/L (ref 96–106)
Creatinine, Ser: 0.78 mg/dL (ref 0.57–1.00)
Glucose: 108 mg/dL — ABNORMAL HIGH (ref 70–99)
Potassium: 4.5 mmol/L (ref 3.5–5.2)
Sodium: 141 mmol/L (ref 134–144)
eGFR: 90 mL/min/{1.73_m2} (ref >59)

## 2022-08-31 ENCOUNTER — Ambulatory Visit: Payer: Commercial Managed Care - HMO | Admitting: Nurse Practitioner

## 2024-01-20 IMAGING — DX DG HIP (WITH OR WITHOUT PELVIS) 2-3V*L*
3 series · 3 of 3 positions shown · non-contrast
Comparison: None.

CLINICAL DATA: LEFT thigh pain, increased with movement and
standing. No known injury.

EXAM:
DG HIP (WITH OR WITHOUT PELVIS) 2-3V LEFT

[pelvis ap]
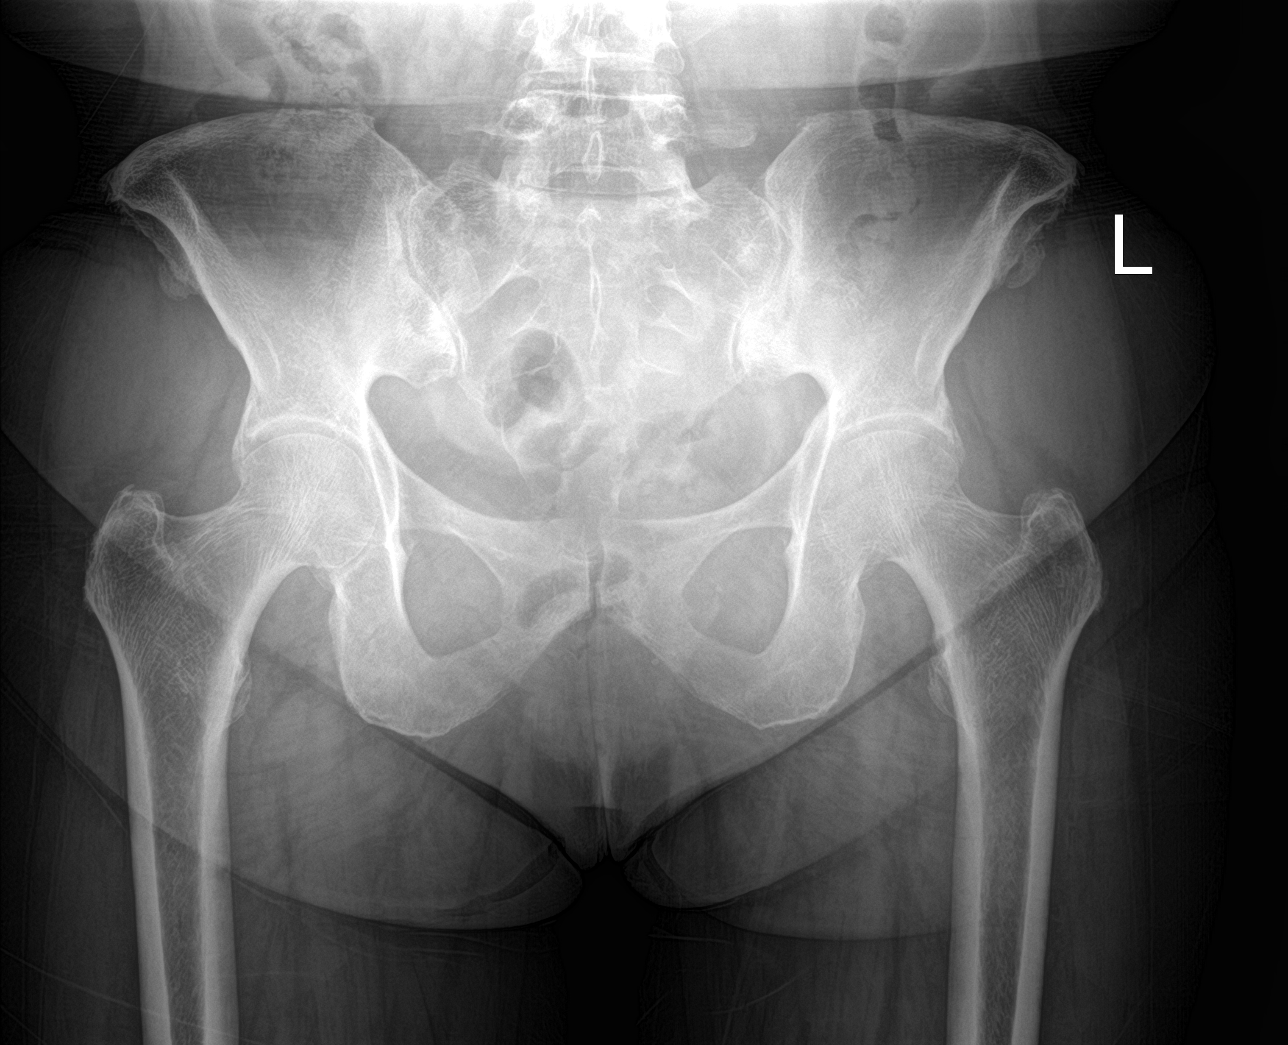

[hip ap]
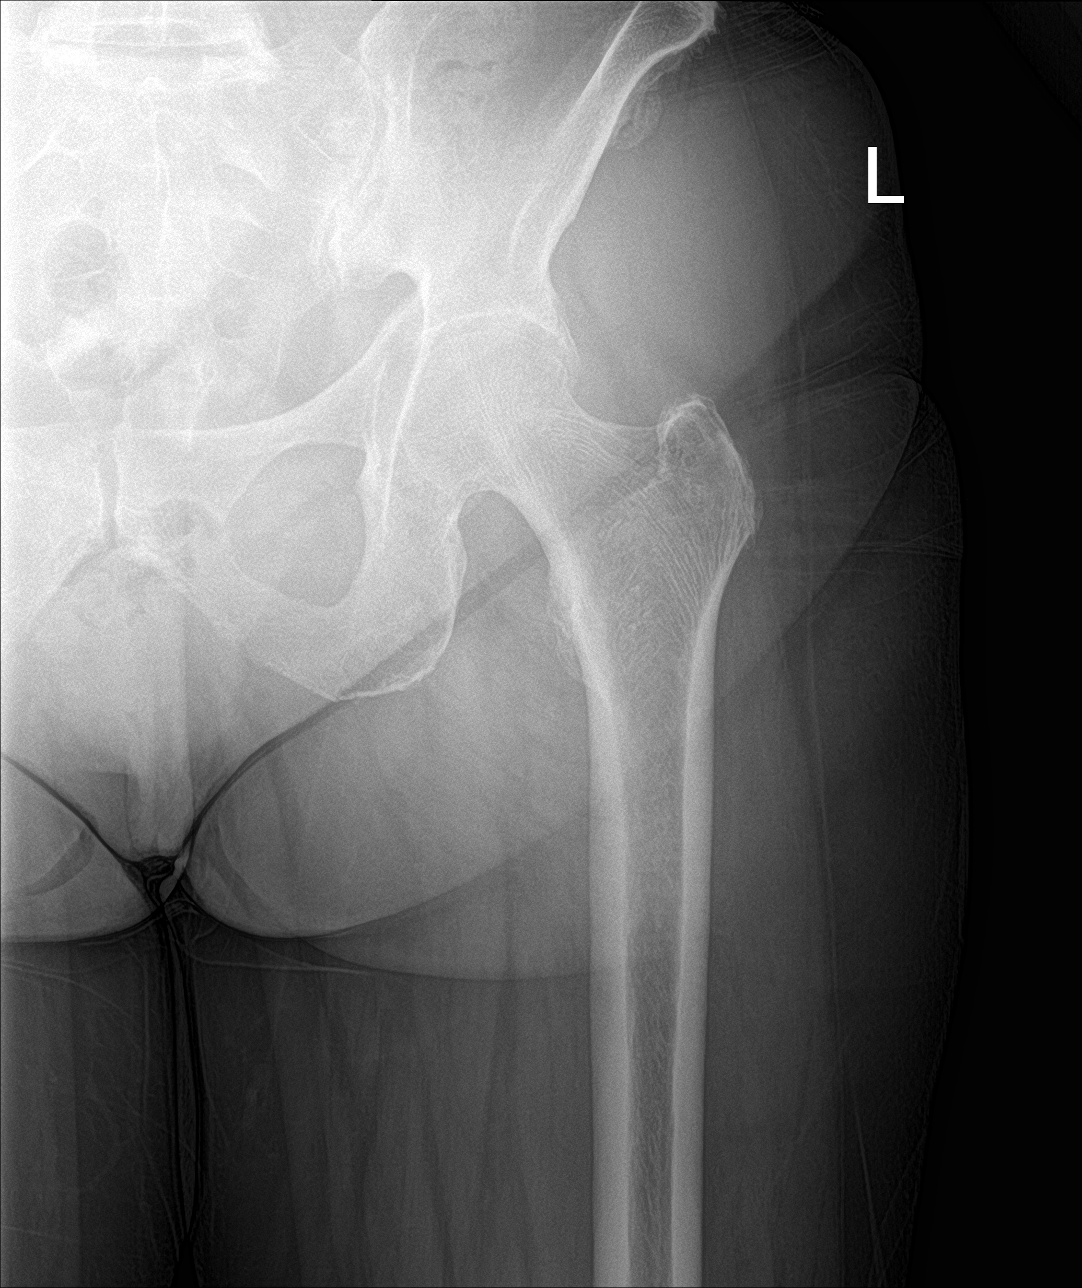

[hip lat]
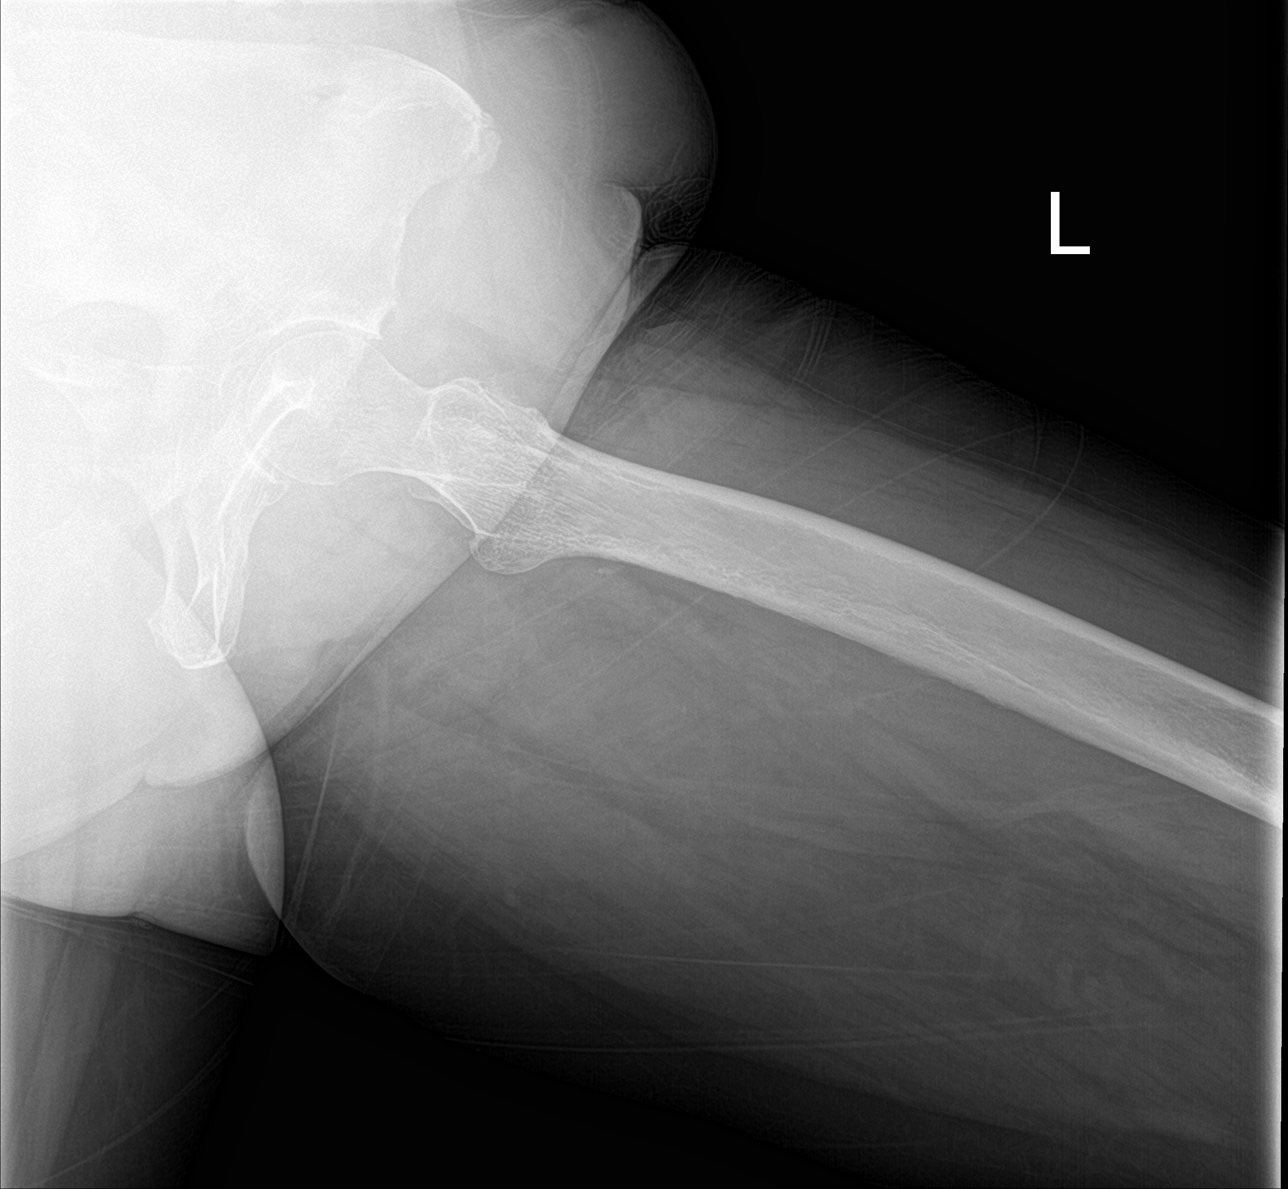

[3 of 3 positions shown; findings below may reference images not displayed]

FINDINGS: There is no evidence of hip fracture or dislocation. There is no
evidence of arthropathy or other focal bone abnormality.
IMPRESSION: Negative.
